# Patient Record
Sex: Female | Born: 1960 | Race: White | Hispanic: No | Smoking: Never smoker
Health system: Southern US, Community
[De-identification: ages and names within clinical notes are randomized; demographics above are authoritative.]

## PROBLEM LIST (undated history)

## (undated) DIAGNOSIS — E079 Disorder of thyroid, unspecified: Secondary | ICD-10-CM

## (undated) DIAGNOSIS — I10 Essential (primary) hypertension: Secondary | ICD-10-CM

## (undated) DIAGNOSIS — E119 Type 2 diabetes mellitus without complications: Secondary | ICD-10-CM

---

## 2006-09-19 ENCOUNTER — Ambulatory Visit (HOSPITAL_COMMUNITY): Admission: RE | Admit: 2006-09-19 | Discharge: 2006-09-19 | Payer: Self-pay | Admitting: Family Medicine

## 2006-09-27 ENCOUNTER — Encounter: Admission: RE | Admit: 2006-09-27 | Discharge: 2006-09-27 | Payer: Self-pay | Admitting: Family Medicine

## 2008-02-29 ENCOUNTER — Ambulatory Visit (HOSPITAL_COMMUNITY): Admission: RE | Admit: 2008-02-29 | Discharge: 2008-02-29 | Payer: Self-pay | Admitting: Family Medicine

## 2008-09-20 ENCOUNTER — Encounter (INDEPENDENT_AMBULATORY_CARE_PROVIDER_SITE_OTHER): Payer: Self-pay | Admitting: Obstetrics & Gynecology

## 2008-09-20 ENCOUNTER — Ambulatory Visit (HOSPITAL_COMMUNITY): Admission: RE | Admit: 2008-09-20 | Discharge: 2008-09-20 | Payer: Self-pay | Admitting: Obstetrics & Gynecology

## 2009-03-22 ENCOUNTER — Emergency Department (HOSPITAL_COMMUNITY): Admission: EM | Admit: 2009-03-22 | Discharge: 2009-03-22 | Payer: Self-pay | Admitting: Emergency Medicine

## 2009-03-23 ENCOUNTER — Encounter (INDEPENDENT_AMBULATORY_CARE_PROVIDER_SITE_OTHER): Payer: Self-pay | Admitting: General Surgery

## 2009-03-23 ENCOUNTER — Inpatient Hospital Stay (HOSPITAL_COMMUNITY): Admission: EM | Admit: 2009-03-23 | Discharge: 2009-03-27 | Payer: Self-pay | Admitting: Emergency Medicine

## 2010-05-18 LAB — DIFFERENTIAL
Band Neutrophils: 33 % — ABNORMAL HIGH (ref 0–10)
Basophils Absolute: 0 10*3/uL (ref 0.0–0.1)
Basophils Absolute: 0 10*3/uL (ref 0.0–0.1)
Basophils Absolute: 0 10*3/uL (ref 0.0–0.1)
Basophils Absolute: 0.1 10*3/uL (ref 0.0–0.1)
Basophils Relative: 0 % (ref 0–1)
Basophils Relative: 0 % (ref 0–1)
Basophils Relative: 0 % (ref 0–1)
Basophils Relative: 0 % (ref 0–1)
Basophils Relative: 1 % (ref 0–1)
Eosinophils Absolute: 0 10*3/uL (ref 0.0–0.7)
Eosinophils Absolute: 0 10*3/uL (ref 0.0–0.7)
Eosinophils Absolute: 0 10*3/uL (ref 0.0–0.7)
Eosinophils Absolute: 0.1 10*3/uL (ref 0.0–0.7)
Eosinophils Relative: 0 % (ref 0–5)
Eosinophils Relative: 0 % (ref 0–5)
Lymphocytes Relative: 4 % — ABNORMAL LOW (ref 12–46)
Lymphocytes Relative: 9 % — ABNORMAL LOW (ref 12–46)
Lymphs Abs: 0.6 10*3/uL — ABNORMAL LOW (ref 0.7–4.0)
Lymphs Abs: 0.7 10*3/uL (ref 0.7–4.0)
Monocytes Absolute: 0.6 10*3/uL (ref 0.1–1.0)
Monocytes Absolute: 0.8 10*3/uL (ref 0.1–1.0)
Monocytes Relative: 5 % (ref 3–12)
Monocytes Relative: 5 % (ref 3–12)
Neutro Abs: 12.4 10*3/uL — ABNORMAL HIGH (ref 1.7–7.7)
Neutro Abs: 7.5 10*3/uL (ref 1.7–7.7)
Neutrophils Relative %: 82 % — ABNORMAL HIGH (ref 43–77)
Neutrophils Relative %: 88 % — ABNORMAL HIGH (ref 43–77)
Neutrophils Relative %: 90 % — ABNORMAL HIGH (ref 43–77)
Promyelocytes Absolute: 0 %
WBC Morphology: INCREASED

## 2010-05-18 LAB — COMPREHENSIVE METABOLIC PANEL
ALT: 26 U/L (ref 0–35)
ALT: 32 U/L (ref 0–35)
AST: 22 U/L (ref 0–37)
Albumin: 3.5 g/dL (ref 3.5–5.2)
Alkaline Phosphatase: 54 U/L (ref 39–117)
Alkaline Phosphatase: 65 U/L (ref 39–117)
CO2: 23 mEq/L (ref 19–32)
Calcium: 9 mg/dL (ref 8.4–10.5)
Chloride: 105 mEq/L (ref 96–112)
Creatinine, Ser: 0.82 mg/dL (ref 0.4–1.2)
GFR calc Af Amer: >60 mL/min (ref >60)
GFR calc non Af Amer: >60 mL/min (ref >60)
Potassium: 3.2 mEq/L — ABNORMAL LOW (ref 3.5–5.1)
Sodium: 133 mEq/L — ABNORMAL LOW (ref 135–145)
Sodium: 138 mEq/L (ref 135–145)
Total Bilirubin: 0.8 mg/dL (ref 0.3–1.2)
Total Protein: 6.7 g/dL (ref 6.0–8.3)

## 2010-05-18 LAB — CBC
HCT: 32.9 % — ABNORMAL LOW (ref 36.0–46.0)
Hemoglobin: 11.3 g/dL — ABNORMAL LOW (ref 12.0–15.0)
MCHC: 33.9 g/dL (ref 30.0–36.0)
MCHC: 33.9 g/dL (ref 30.0–36.0)
MCHC: 34.3 g/dL (ref 30.0–36.0)
MCV: 84.7 fL (ref 78.0–100.0)
MCV: 85.3 fL (ref 78.0–100.0)
MCV: 86.1 fL (ref 78.0–100.0)
Platelets: 164 10*3/uL (ref 150–400)
Platelets: 184 10*3/uL (ref 150–400)
RBC: 3.88 MIL/uL (ref 3.87–5.11)
RBC: 4.2 MIL/uL (ref 3.87–5.11)
RBC: 4.58 MIL/uL (ref 3.87–5.11)
RDW: 13.9 % (ref 11.5–15.5)
RDW: 14 % (ref 11.5–15.5)
RDW: 14.1 % (ref 11.5–15.5)
WBC: 11.9 10*3/uL — ABNORMAL HIGH (ref 4.0–10.5)
WBC: 13.2 10*3/uL — ABNORMAL HIGH (ref 4.0–10.5)

## 2010-05-18 LAB — BASIC METABOLIC PANEL
CO2: 24 mEq/L (ref 19–32)
CO2: 25 mEq/L (ref 19–32)
Calcium: 7.7 mg/dL — ABNORMAL LOW (ref 8.4–10.5)
Chloride: 102 mEq/L (ref 96–112)
Creatinine, Ser: 0.61 mg/dL (ref 0.4–1.2)
GFR calc Af Amer: >60 mL/min (ref >60)
GFR calc Af Amer: >60 mL/min (ref >60)
GFR calc non Af Amer: >60 mL/min (ref >60)
Glucose, Bld: 115 mg/dL — ABNORMAL HIGH (ref 70–99)
Potassium: 3.7 mEq/L (ref 3.5–5.1)
Sodium: 132 mEq/L — ABNORMAL LOW (ref 135–145)

## 2010-05-18 LAB — URINALYSIS, ROUTINE W REFLEX MICROSCOPIC
Glucose, UA: 100 mg/dL — AB
Hgb urine dipstick: NEGATIVE
Protein, ur: 100 mg/dL — AB
Specific Gravity, Urine: 1.01 (ref 1.005–1.030)
Urobilinogen, UA: 1 mg/dL (ref 0.0–1.0)
Urobilinogen, UA: >8 mg/dL — ABNORMAL HIGH (ref 0.0–1.0)

## 2010-05-18 LAB — MAGNESIUM: Magnesium: 2.1 mg/dL (ref 1.5–2.5)

## 2010-05-18 LAB — URINE MICROSCOPIC-ADD ON

## 2010-05-18 LAB — LIPASE, BLOOD: Lipase: 24 U/L (ref 11–59)

## 2010-05-18 LAB — PHOSPHORUS: Phosphorus: 2.1 mg/dL — ABNORMAL LOW (ref 2.3–4.6)

## 2010-06-07 LAB — CBC
HCT: 38.8 % (ref 36.0–46.0)
Hemoglobin: 13.4 g/dL (ref 12.0–15.0)
RBC: 4.82 MIL/uL (ref 3.87–5.11)
RDW: 14.6 % (ref 11.5–15.5)
WBC: 7.1 10*3/uL (ref 4.0–10.5)

## 2010-07-14 NOTE — Op Note (Signed)
[name], Judith Cain NO.:  [id number]   MEDICAL RECORD NO.:  [id number]          PATIENT TYPE:  AMB   LOCATION:  SDC                           FACILITY:  [facility]   PHYSICIAN:  Ilda Mori, M.D.   DATE OF BIRTH:  [birth year]   DATE OF PROCEDURE:  09/20/2008  DATE OF DISCHARGE:  09/20/2008                               OPERATIVE REPORT   PREOPERATIVE DIAGNOSIS:  Menorrhagia, possible endometrial polyp.   POSTOPERATIVE DIAGNOSIS:  Menorrhagia.   PROCEDURES:  Hysteroscopy, dilation and curettage, and NovaSure  endometrial ablation.   SURGEON:  Ilda Mori, MD   ANESTHESIA:  General.   ESTIMATED BLOOD LOSS:  Minimal.   FINDINGS:  Normal-appearing uterine cavity.  No endometrial polyps seen.   SPECIMENS:  Endometrial curettings.   COMPLICATIONS:  None.   INDICATIONS:  This is a 50 year old gravida 4, para 65 female, who  developed over the last 2-3 years heavy painful menstrual periods.  These periods were not controlled by hormones and decision made to  proceed with endometrial ablation.   PROCEDURE IN DETAILS:  The patient was taken to the operating room and  general anesthesia was induced.  She was placed in dorsal lithotomy  position and the perineum and vagina were prepped and draped in sterile  fashion.  The cervix was sounded 3.5 cm and the endometrium was sounded  to 8.5 cm.  The internal os was dilated with [place] dilators to 19-  [place] and a diagnostic hysteroscope was introduced and the uterus was  viewed and no lesions or abnormalities were seen.  The hysteroscope was  removed.  The internal os was dilated further to a 25-French.  The  NovaSure instrument was placed and seated in the endometrial cavity, so  that the final width was 4.8 and the length had been calculated earlier  at 5 cm.  The CO2 test was passed and ablation was carried out for  approximately 1 minute and 55 seconds.  At this point, the instrument  automatically shut off and  was removed from the endometrial cavity and  the procedure was terminated.  The patient tolerated the procedure well  and left the operating room in good condition.     Ilda Mori, M.D.  Electronically Signed    RK/MEDQ  D:  09/20/2008  T:  09/21/2008  Job:  [id number]

## 2010-08-25 ENCOUNTER — Other Ambulatory Visit: Payer: Self-pay | Admitting: Obstetrics & Gynecology

## 2011-01-04 IMAGING — CT CT ABD-PELV W/ CM
2 of 5 series · 16 of 46 positions shown, 18 images · IV contrast (agent unspecified)
Comparison: 03/22/2009

CLINICAL DATA: Abdominal pain for 1 week.

CT ABDOMEN AND PELVIS WITH CONTRAST
TECHNIQUE: Multidetector CT imaging of the abdomen and pelvis was
performed following the standard protocol during bolus
administration of intravenous contrast.
Contrast: 100 ml 0mnipaque-IUU

[Series 2: abd_pel_with 5.0 b40f · axial · 0.78mm/px · z∈[-523,-88]mm · 13 of 99 slices shown, 15 images]
[im 6/99  soft-tissue]
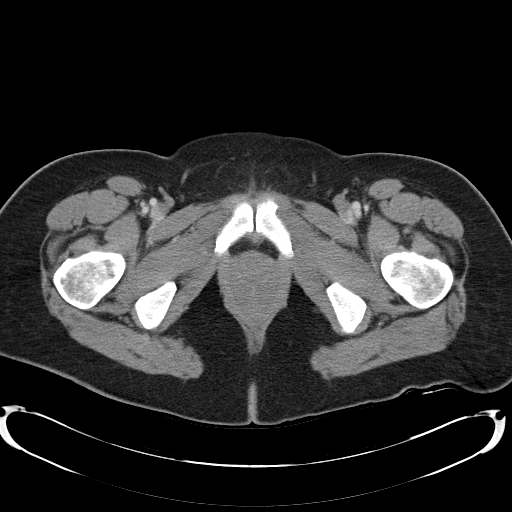
[im 6/99  bone]
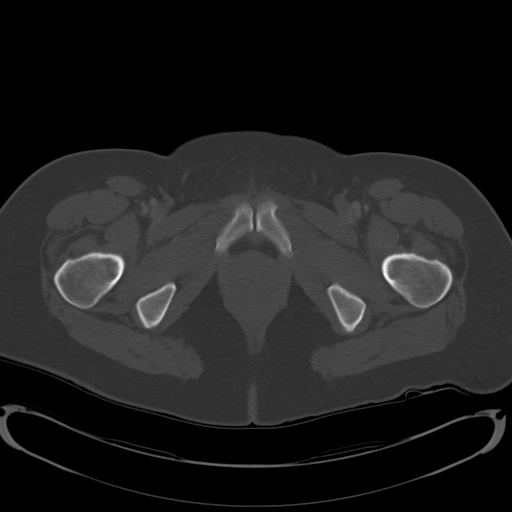
[im 11/99  soft-tissue]
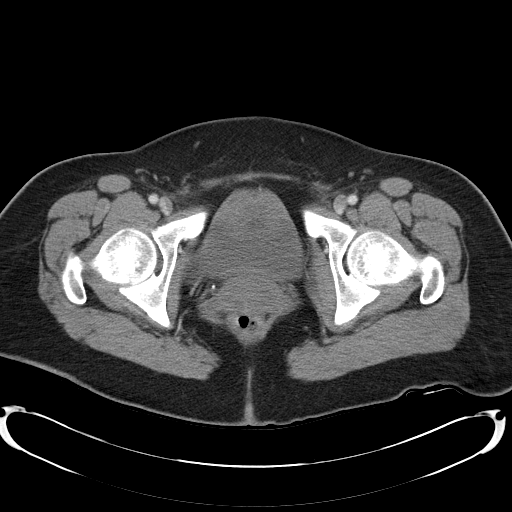
[im 22/99  soft-tissue]
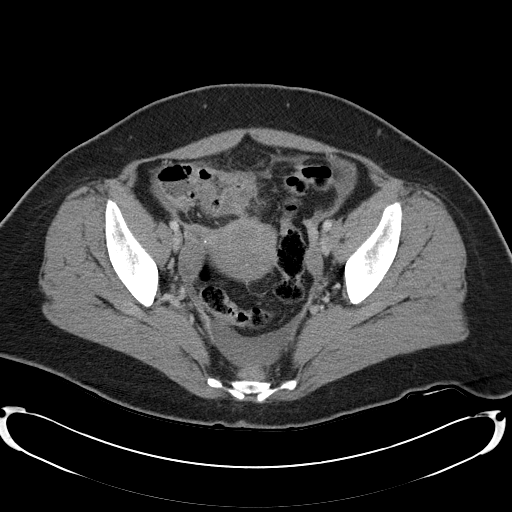
[im 28/99  soft-tissue]
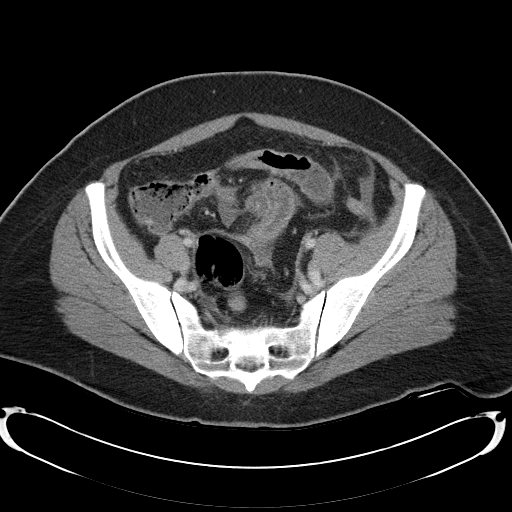
[im 33/99  soft-tissue]
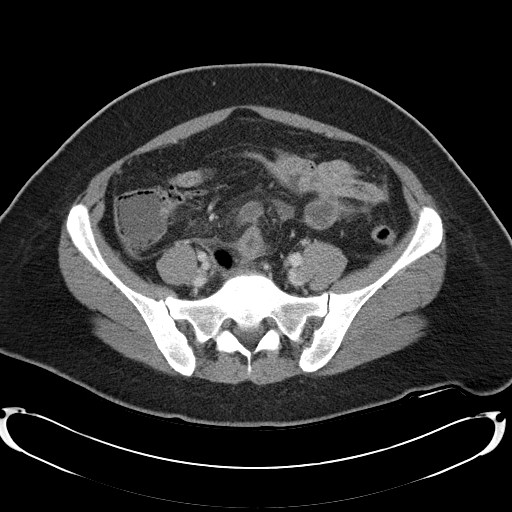
[im 44/99  soft-tissue]
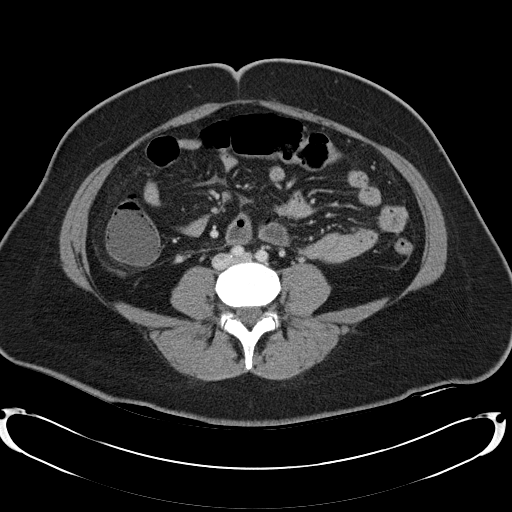
[im 50/99  soft-tissue]
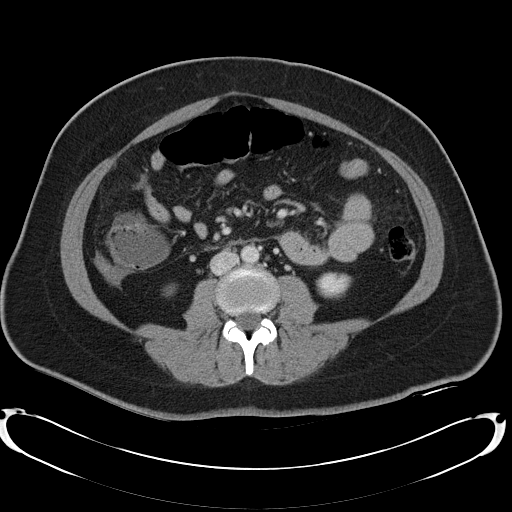
[im 55/99  soft-tissue]
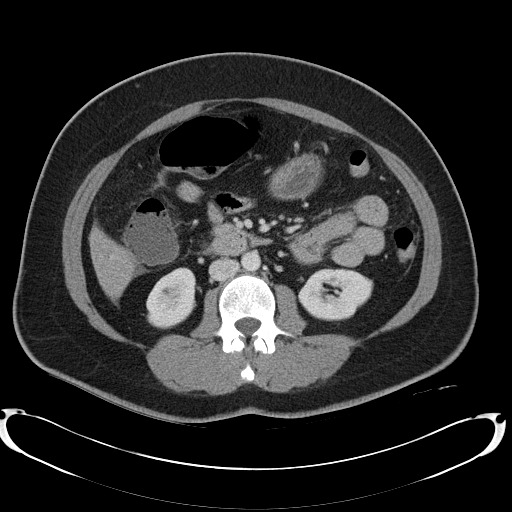
[im 66/99  soft-tissue]
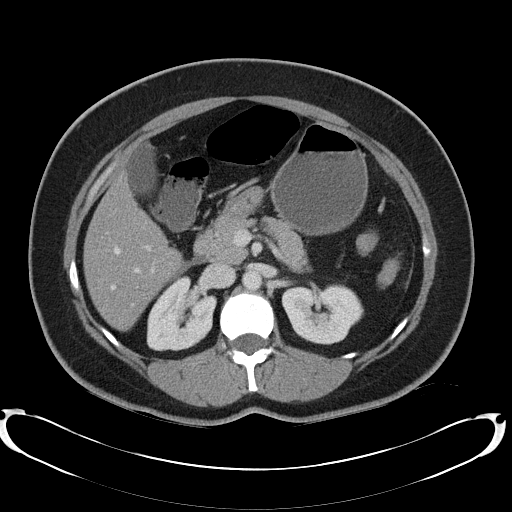
[im 66/99  bone]
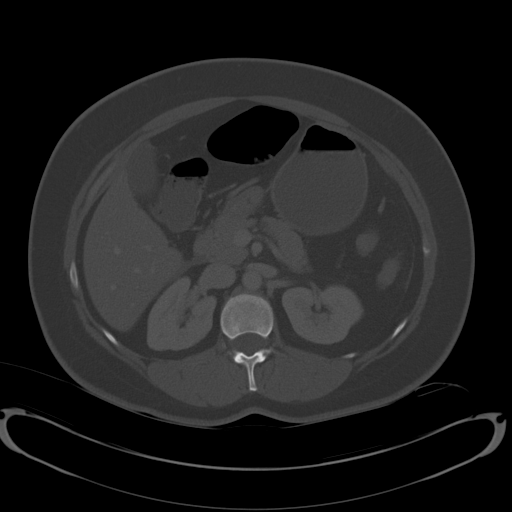
[im 71/99  soft-tissue]
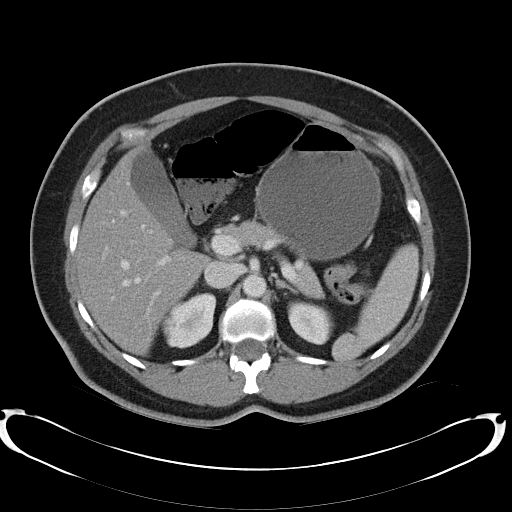
[im 77/99  soft-tissue]
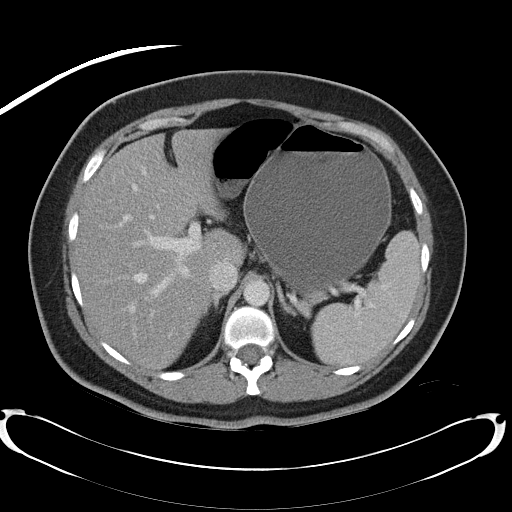
[im 88/99  soft-tissue]
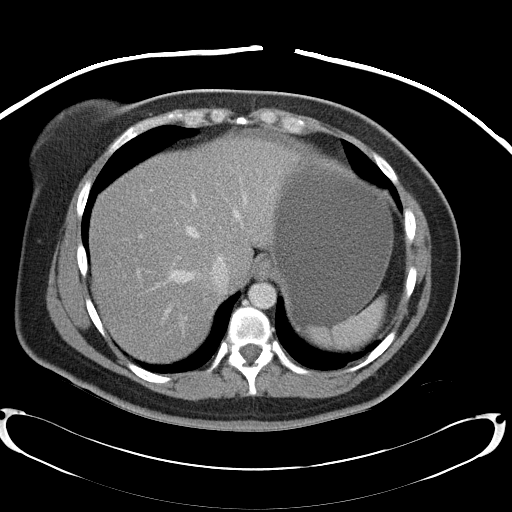
[im 93/99  soft-tissue]
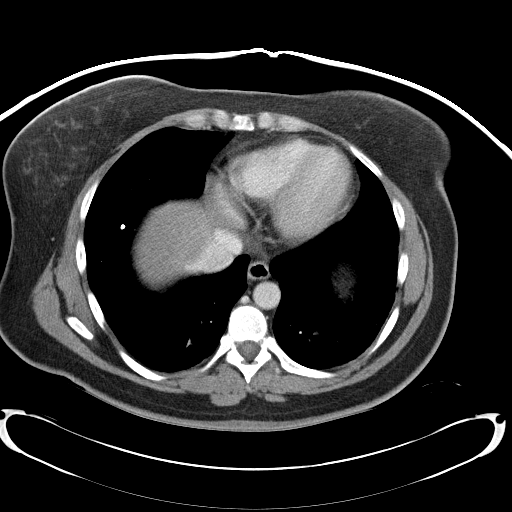

[Series 4: mpr cor post contrast (id) · coronal · 0.78mm/px · 3 of 95 slices shown]
[im 32/95  soft-tissue]
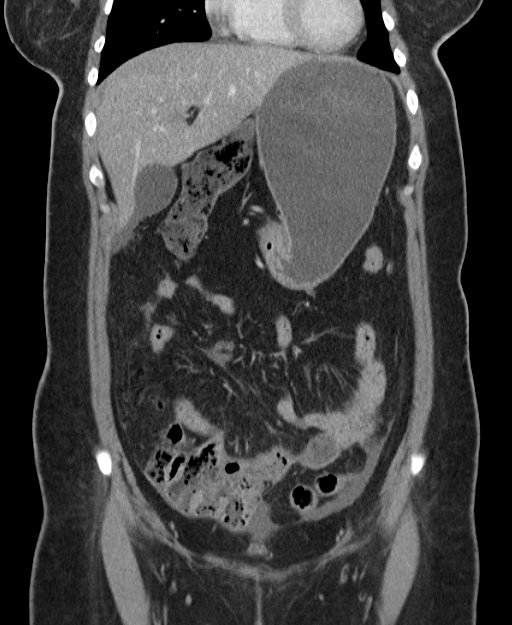
[im 42/95  soft-tissue]
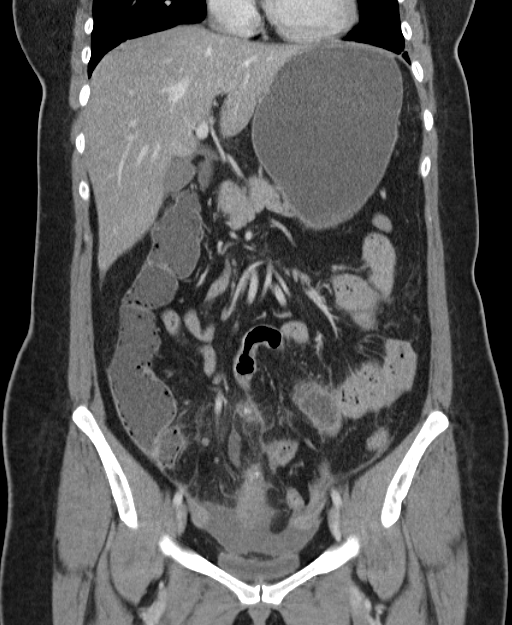
[im 53/95  soft-tissue]
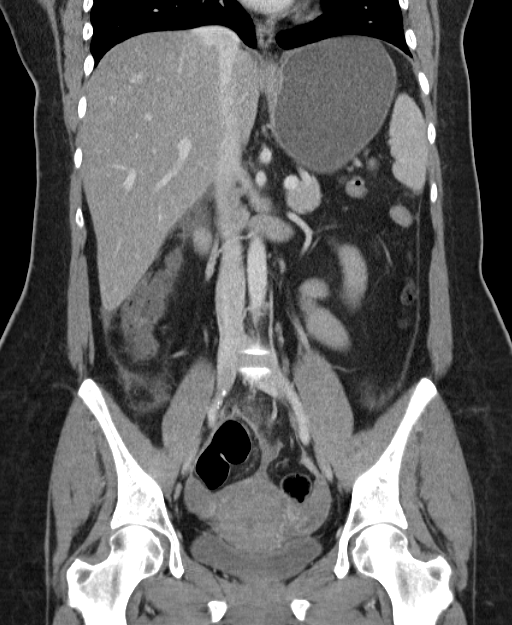

[16 of 46 positions shown; findings below may reference images not displayed]

FINDINGS: Calcified granulomas are present anteriorly in the right
lower lobe.  There is linear subsegmental atelectasis in the left
lower lobe.

Diffuse hepatic steatosis noted.  Acute appendicitis is present
with the appendix measuring up to 1.7 cm in diameter, with internal
appendicoliths.  There is stranding around the appendix as well as
fluid in the right pericolic gutter.  The appendix extends near the
midline, just above the uterine fundus.

The spleen appears unremarkable, as do the pancreas and adrenal
glands.  A hypodense 1 cm right renal hypodense lesion in the upper
pole likely represents a cyst.  There is a 2 mm nonobstructive
right kidney upper pole calculus, as well as some mild right renal
scarring.  The left kidney appears normal.

There is some secondary inflammation of the terminal ileum due to
the appendicitis.  Pelvic ascites noted, moderate in amount.  No
extraluminal gas is identified.  No discrete abscess noted.

Urinary bladder appears unremarkable.  The uterus and adnexa appear
normal.
IMPRESSION: 1.  Acute appendicitis with mild to moderate pelvic ascites.  No
discrete abscess or extraluminal gas.  The appendix is markedly
inflamed and contain several appendicoliths, and there is some
secondary inflammation of the adjacent terminal ileum.
2.  Diffuse hepatic steatosis.
3.  Old granulomatous disease.
4.  Suspected 2 mm right kidney upper pole nonobstructive calculus.

## 2012-03-01 HISTORY — PX: APPENDECTOMY: SHX54

## 2012-09-25 ENCOUNTER — Telehealth: Payer: Self-pay | Admitting: Gastroenterology

## 2012-09-25 NOTE — Telephone Encounter (Signed)
rec'd records from [facility] 4 pages to Dr.Kaplan

## 2014-09-19 ENCOUNTER — Other Ambulatory Visit: Payer: Self-pay | Admitting: Family Medicine

## 2014-09-19 DIAGNOSIS — Z1231 Encounter for screening mammogram for malignant neoplasm of breast: Secondary | ICD-10-CM

## 2014-09-26 ENCOUNTER — Ambulatory Visit: Payer: Self-pay

## 2014-10-17 ENCOUNTER — Ambulatory Visit: Payer: Self-pay

## 2015-01-29 DIAGNOSIS — G5602 Carpal tunnel syndrome, left upper limb: Secondary | ICD-10-CM | POA: Insufficient documentation

## 2015-01-29 DIAGNOSIS — I152 Hypertension secondary to endocrine disorders: Secondary | ICD-10-CM | POA: Insufficient documentation

## 2015-01-29 DIAGNOSIS — E1169 Type 2 diabetes mellitus with other specified complication: Secondary | ICD-10-CM | POA: Insufficient documentation

## 2015-01-29 DIAGNOSIS — K219 Gastro-esophageal reflux disease without esophagitis: Secondary | ICD-10-CM | POA: Insufficient documentation

## 2015-01-29 DIAGNOSIS — E039 Hypothyroidism, unspecified: Secondary | ICD-10-CM | POA: Insufficient documentation

## 2015-01-29 DIAGNOSIS — N393 Stress incontinence (female) (male): Secondary | ICD-10-CM | POA: Insufficient documentation

## 2017-06-19 ENCOUNTER — Emergency Department (HOSPITAL_COMMUNITY): Payer: BLUE CROSS/BLUE SHIELD

## 2017-06-19 ENCOUNTER — Encounter (HOSPITAL_COMMUNITY): Payer: Self-pay | Admitting: Emergency Medicine

## 2017-06-19 ENCOUNTER — Emergency Department (HOSPITAL_COMMUNITY)
Admission: EM | Admit: 2017-06-19 | Discharge: 2017-06-20 | Disposition: A | Discharge status: Home / Self Care | Payer: BLUE CROSS/BLUE SHIELD | Attending: Emergency Medicine | Admitting: Emergency Medicine

## 2017-06-19 ENCOUNTER — Other Ambulatory Visit: Payer: Self-pay

## 2017-06-19 DIAGNOSIS — I10 Essential (primary) hypertension: Secondary | ICD-10-CM | POA: Diagnosis not present

## 2017-06-19 DIAGNOSIS — E119 Type 2 diabetes mellitus without complications: Secondary | ICD-10-CM | POA: Insufficient documentation

## 2017-06-19 DIAGNOSIS — M5136 Other intervertebral disc degeneration, lumbar region: Secondary | ICD-10-CM | POA: Diagnosis not present

## 2017-06-19 DIAGNOSIS — J111 Influenza due to unidentified influenza virus with other respiratory manifestations: Secondary | ICD-10-CM | POA: Insufficient documentation

## 2017-06-19 DIAGNOSIS — R509 Fever, unspecified: Secondary | ICD-10-CM | POA: Diagnosis present

## 2017-06-19 HISTORY — DX: Type 2 diabetes mellitus without complications: E11.9

## 2017-06-19 HISTORY — DX: Disorder of thyroid, unspecified: E07.9

## 2017-06-19 HISTORY — DX: Essential (primary) hypertension: I10

## 2017-06-19 LAB — COMPREHENSIVE METABOLIC PANEL
ALBUMIN: 3.7 g/dL (ref 3.5–5.0)
ALK PHOS: 101 U/L (ref 38–126)
ALT: 22 U/L (ref 14–54)
AST: 18 U/L (ref 15–41)
Anion gap: 11 (ref 5–15)
BUN: 13 mg/dL (ref 6–20)
CALCIUM: 9.5 mg/dL (ref 8.9–10.3)
CO2: 23 mmol/L (ref 22–32)
CREATININE: 0.72 mg/dL (ref 0.44–1.00)
Chloride: 107 mmol/L (ref 101–111)
GFR calc Af Amer: >60 mL/min (ref >60)
GFR calc non Af Amer: >60 mL/min (ref >60)
GLUCOSE: 156 mg/dL — AB (ref 65–99)
Potassium: 4 mmol/L (ref 3.5–5.1)
Sodium: 141 mmol/L (ref 135–145)
TOTAL PROTEIN: 7.1 g/dL (ref 6.5–8.1)
Total Bilirubin: 0.7 mg/dL (ref 0.3–1.2)

## 2017-06-19 LAB — CBC WITH DIFFERENTIAL/PLATELET
BASOS ABS: 0 10*3/uL (ref 0.0–0.1)
BASOS PCT: 0 %
EOS PCT: 2 %
Eosinophils Absolute: 0.1 10*3/uL (ref 0.0–0.7)
HCT: 34.2 % — ABNORMAL LOW (ref 36.0–46.0)
Hemoglobin: 11.1 g/dL — ABNORMAL LOW (ref 12.0–15.0)
Lymphocytes Relative: 23 %
Lymphs Abs: 2 10*3/uL (ref 0.7–4.0)
MCH: 27.8 pg (ref 26.0–34.0)
MCHC: 32.5 g/dL (ref 30.0–36.0)
MCV: 85.5 fL (ref 78.0–100.0)
Monocytes Absolute: 0.6 10*3/uL (ref 0.1–1.0)
Monocytes Relative: 7 %
Neutro Abs: 6.1 10*3/uL (ref 1.7–7.7)
Neutrophils Relative %: 68 %
PLATELETS: 272 10*3/uL (ref 150–400)
RBC: 4 MIL/uL (ref 3.87–5.11)
RDW: 13.2 % (ref 11.5–15.5)
WBC: 8.8 10*3/uL (ref 4.0–10.5)

## 2017-06-19 LAB — URINALYSIS, ROUTINE W REFLEX MICROSCOPIC
Bilirubin Urine: NEGATIVE
GLUCOSE, UA: NEGATIVE mg/dL
Hgb urine dipstick: NEGATIVE
KETONES UR: NEGATIVE mg/dL
LEUKOCYTES UA: NEGATIVE
NITRITE: NEGATIVE
PROTEIN: NEGATIVE mg/dL
Specific Gravity, Urine: 1.012 (ref 1.005–1.030)
pH: 7 (ref 5.0–8.0)

## 2017-06-19 LAB — GROUP A STREP BY PCR: Group A Strep by PCR: NOT DETECTED

## 2017-06-19 MED ORDER — ONDANSETRON HCL 4 MG/2ML IJ SOLN
4.0000 mg | Freq: Once | INTRAMUSCULAR | Status: AC
  Administered 2017-06-19: 4 mg via INTRAVENOUS
  Filled 2017-06-19: qty 2

## 2017-06-19 MED ORDER — IBUPROFEN 400 MG PO TABS
400.0000 mg | ORAL_TABLET | Freq: Once | ORAL | Status: AC
  Administered 2017-06-19: 400 mg via ORAL
  Filled 2017-06-19: qty 1

## 2017-06-19 MED ORDER — SODIUM CHLORIDE 0.9 % IV SOLN: 1000.0000 mL | INTRAVENOUS | Status: DC

## 2017-06-19 MED ORDER — SODIUM CHLORIDE 0.9 % IV BOLUS (SEPSIS)
1000.0000 mL | Freq: Once | INTRAVENOUS | Status: AC
  Administered 2017-06-19: 1000 mL via INTRAVENOUS

## 2017-06-19 MED ORDER — HYDROCODONE-ACETAMINOPHEN 5-325 MG PO TABS
2.0000 | ORAL_TABLET | Freq: Once | ORAL | Status: AC
  Administered 2017-06-19: 2 via ORAL
  Filled 2017-06-19: qty 2

## 2017-06-19 NOTE — ED Notes (Signed)
Pt denies cough, fever started last week, dx with flu and has finished tamiflu.  Pt with continued with bilat. LE pain. Pt with nausea but denies emesis.  Motrin last at 1330 today.

## 2017-06-19 NOTE — ED Provider Notes (Signed)
[facility] EMERGENCY DEPARTMENT Provider Note   CSN: [id number] Arrival date & time: 06/19/17  2142     History   Chief Complaint Chief Complaint  Patient presents with  . Fever    HPI Judith Cain is a 57 y.o. female.  The patient is a 57 year old female who presents to the emergency department with a complaint of fever and body aches.  Patient states this is been going on for approximately a week.  About for 5 days ago the patient says she was diagnosed with influenza A.  She was placed on Tamiflu.  She says she has finished the Tamiflu flu, and continues to have headaches, neck soreness.  She had a temperature a few days ago with a maximum of 101.  She says this has been improving over the last 48-72 hours.  She has had problems with nausea.  There is been no recent rash reported.  No changes in her stool, no dysuria, no hematuria.  It is of note that she has back pain.  She had back pain before for the issue was noted with the flu.  The patient states that she has diabetes, and she is concerned that she is still having symptoms after the diagnosis of the flu and being treated with Tamiflu.  The patient states that she has pain and weakness from the knees down to her feet, and she is also concerned that something with her back may be getting worse causing this problem.  No recent falls.     Past Medical History:  Diagnosis Date  . Diabetes mellitus without complication (HCC)   . Hypertension   . Thyroid disease     There are no active problems to display for this patient.   Past Surgical History:  Procedure Laterality Date  . APPENDECTOMY  2014     OB History   None      Home Medications    Prior to Admission medications   Not on File    Family History History reviewed. No pertinent family history.  Social History Social History   Tobacco Use  . Smoking status: Never Smoker  . Smokeless tobacco: Never Used  Substance Use Topics  . Alcohol use:  Never    Frequency: Never  . Drug use: Never     Allergies   Patient has no allergy information on record.   Review of Systems Review of Systems  Constitutional: Positive for activity change, appetite change, chills and fever.       All ROS Neg except as noted in HPI  HENT: Positive for congestion. Negative for nosebleeds.   Eyes: Negative for photophobia and discharge.  Respiratory: Negative for cough, shortness of breath and wheezing.   Cardiovascular: Negative for chest pain, palpitations and leg swelling.  Gastrointestinal: Positive for nausea. Negative for abdominal pain and blood in stool.  Genitourinary: Negative for dysuria, flank pain, frequency and hematuria.  Musculoskeletal: Positive for back pain, myalgias and neck pain. Negative for arthralgias.  Skin: Negative.   Neurological: Positive for headaches. Negative for dizziness, seizures and speech difficulty.  Psychiatric/Behavioral: Negative for confusion and hallucinations.     Physical Exam Updated Vital Signs BP 136/69   Pulse 87   Temp 99.7 F (37.6 C) (Oral)   Resp 20   Wt 86.2 kg (190 lb)   SpO2 99%   Physical Exam  Constitutional: She is oriented to person, place, and time. She appears well-developed and well-nourished.  Non-toxic appearance.  HENT:  Head:  Normocephalic.  Right Ear: Tympanic membrane and external ear normal.  Left Ear: Tympanic membrane and external ear normal.  Eyes: Pupils are equal, round, and reactive to light. EOM and lids are normal.  Neck: Normal range of motion. Neck supple. Carotid bruit is not present.  Cardiovascular: Normal rate, regular rhythm, normal heart sounds, intact distal pulses and normal pulses.  Pulmonary/Chest: Breath sounds normal. No respiratory distress.  Abdominal: Soft. Bowel sounds are normal. There is no tenderness. There is no guarding.  No abdominal mass or pulsating mass appreciated.  Musculoskeletal: Normal range of motion.  There is soreness of  the cervical spine with range of motion.  There is no palpable step-off.  There are no hot areas appreciated.  There is no palpable step-off of the thoracic spine.  There is no palpable step-off of the lumbar spine.  There is pain to palpation of the lower lumbar spine.  There is pain with change of position in the lumbar area and on the left paraspinal area.  There is good range of motion of the upper and lower extremities.  The patient is slower to move the lower extremities.  Radial pulses are 2+.  Dorsalis pedis pulses are 2+.  No significant swelling of the lower extremities.  Negative Homans sign.  No temperature changes noted of the lower extremities.  Lymphadenopathy:       Head (right side): No submandibular adenopathy present.       Head (left side): No submandibular adenopathy present.    She has no cervical adenopathy.  Neurological: She is alert and oriented to person, place, and time. She has normal strength. No cranial nerve deficit or sensory deficit.  No sensory deficit noted of the lower extremities.  Patient states she has pain with attempting to comply with my examination of the motor aspect of the lower extremities.  Skin: Skin is warm and dry.  Psychiatric: She has a normal mood and affect. Her speech is normal.  Nursing note and vitals reviewed.    ED Treatments / Results  Labs (all labs ordered are listed, but only abnormal results are displayed) Labs Reviewed - No data to display  EKG None  Radiology No results found.  Procedures Procedures (including critical care time)  Medications Ordered in ED Medications - No data to display   Initial Impression / Assessment and Plan / ED Course  I have reviewed the triage vital signs and the nursing notes.  Pertinent labs & imaging results that were available during my care of the patient were reviewed by me and considered in my medical decision making (see chart for details).       Final Clinical  Impressions(s) / ED Diagnoses  Vital signs within normal limits with the exception of the temperature being 99.7.  Pulse oximetry is 99% on room air.  Will check for strep infection, pneumonia, spinal lesion causing pain and or weakness of the lower extremity, electrolyte imbalances.  No evidence on examination for meningitis.  No gross neurologic deficits appreciated on examination.  Glucose is slightly elevated on the comprehensive metabolic panel at 156, otherwise the metabolic panel is essentially within normal limits.  The anion gap is 11. The complete blood count shows the white blood cells to be normal at 8800, hemoglobin and hematocrit were slightly low at 11.1, and 34.2.  Urine analysis is negative for acute urinary infection or kidney stone.  Strep test is negative.  Chest x-ray shows no acute cardiopulmonary issues.  In particular there  is no evidence of pneumonia.  CT scan of the lumbar spine reveals several areas of bulging of the lumbar disc, but no significant canal stenosis or foraminal narrowing.  There is noted some aortic atherosclerosis.  I discussed these findings with the patient in terms of which she understands.  The patient states she feels some better after ibuprofen and pain medication and IV fluids.  The nausea has improved after IV Zofran.  The patient will be treated with prescription of Zofran, ibuprofen, and Ultram.  I have asked the patient to increase fluids.  And to follow-up with her primary physician concerning these flulike symptoms, as well as the lower extremity pain.  I have asked her to return to the emergency department immediately if any changes in her condition, problems, or concerns.   Final diagnoses:  Influenza  DDD (degenerative disc disease), lumbar    ED Discharge Orders        Ordered    ondansetron (ZOFRAN) 4 MG tablet  3 times daily after meals & bedtime     06/20/17 0117    ibuprofen (ADVIL,MOTRIN) 400 MG tablet  Every 6 hours PRN      06/20/17 0117    traMADol (ULTRAM) 50 MG tablet     06/20/17 0117       Ivery QualeBryant, Malayjah Otoole, PA-C 06/20/17 0117    Samuel JesterMcManus, Kathleen, DO 06/20/17 2350

## 2017-06-19 NOTE — ED Triage Notes (Signed)
Pt was at PCP Monday and told she has flu, given Tamiflu, pt c/o fever, body aches & flu like symptoms

## 2017-06-20 ENCOUNTER — Emergency Department (HOSPITAL_COMMUNITY): Payer: BLUE CROSS/BLUE SHIELD

## 2017-06-20 MED ORDER — ONDANSETRON HCL 4 MG PO TABS: 4.0000 mg | ORAL_TABLET | Freq: Three times a day (TID) | ORAL | 0 refills | Status: DC

## 2017-06-20 MED ORDER — TRAMADOL HCL 50 MG PO TABS: ORAL_TABLET | ORAL | 0 refills | Status: DC

## 2017-06-20 MED ORDER — IBUPROFEN 400 MG PO TABS: 400.0000 mg | ORAL_TABLET | Freq: Four times a day (QID) | ORAL | 0 refills | Status: DC | PRN

## 2017-12-23 ENCOUNTER — Other Ambulatory Visit: Payer: Self-pay

## 2017-12-29 ENCOUNTER — Ambulatory Visit (INDEPENDENT_AMBULATORY_CARE_PROVIDER_SITE_OTHER): Payer: BLUE CROSS/BLUE SHIELD | Admitting: Cardiology

## 2017-12-29 ENCOUNTER — Encounter: Payer: Self-pay | Admitting: Cardiology

## 2017-12-29 VITALS — BP 119/81 | HR 80 | Ht 67.0 in | Wt 203.0 lb

## 2017-12-29 DIAGNOSIS — Z7189 Other specified counseling: Secondary | ICD-10-CM | POA: Diagnosis not present

## 2017-12-29 DIAGNOSIS — I700 Atherosclerosis of aorta: Secondary | ICD-10-CM

## 2017-12-29 DIAGNOSIS — Z8249 Family history of ischemic heart disease and other diseases of the circulatory system: Secondary | ICD-10-CM

## 2017-12-29 DIAGNOSIS — E6609 Other obesity due to excess calories: Secondary | ICD-10-CM | POA: Diagnosis not present

## 2017-12-29 DIAGNOSIS — E782 Mixed hyperlipidemia: Secondary | ICD-10-CM

## 2017-12-29 DIAGNOSIS — Z6831 Body mass index (BMI) 31.0-31.9, adult: Secondary | ICD-10-CM

## 2017-12-29 NOTE — Progress Notes (Signed)
Cardiology Office Note:    Date:  12/29/2017   ID:  Judith Cain, DOB 01/29/1961, MRN [id number]  PCP:  Richmond Campbell., PA-C  Cardiologist:  Jodelle Red, MD PhD  Referring MD: Richmond Campbell., PA-C   CC: establish care, discuss imaging/risk factors  History of Present Illness:    Judith Cain is a 57 y.o. female with a hx of type II diabetes, hypertension, hyperlipidemia who is seen as a new consult at the request of Arlyce Dice, Kristen W., PA-C for the evaluation and management of incidental aortic atherosclerosis, hypertension, and hyperlipidemia.   There is a note in her PCP visit from 12/01/17 noting aortic atherosclerosis, and per the patient this was based on the CT lumbar she had in April noting aortic atherosclerosis. Her last lipids in Care Everywhere were notable for LDL 127, Tchol 184, TG 194, HDL 39. Her last blood pressure reading was 100/72. Her medication list notes losartan, HCTZ, atorvastatin, metformin, and levothyroxine.   Just had her atorvastatin increased to 40 mg a few weeks ago. Tolerating well.   No chest pain. 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 No syncope, no palpitations. No PND or orthopnea. Reports that she snores, wakes up tired. Occasional LE edema when she has been on her feet all day.  FH: father had mitral valve prolapse, son does as well. Father had a pacemaker, prior MI, no bypass surgery. Passed at age 66 after pneumonia and norovirus. Mother is a diabetic but no heart issues. Maternal gpa had MI, maternal gma had stroke.   SH: never smoker. No alcohol. Active at job but no other intentional activity. Diet: avoids sweets, likes potatoes.   Past Medical History:  Diagnosis Date  . Diabetes mellitus without complication (HCC)   . Hypertension   . Thyroid disease     Past Surgical History:  Procedure Laterality Date  . APPENDECTOMY  2014    Current Medications: Current Outpatient  Medications on File Prior to Visit  Medication Sig  . aspirin 81 MG chewable tablet Chew 81 mg by mouth daily.  Marland Kitchen atorvastatin (LIPITOR) 40 MG tablet Take 40 mg by mouth daily.   . hydrochlorothiazide (HYDRODIURIL) 25 MG tablet Take 25 mg by mouth daily.  . IRON PO Take by mouth.  . levothyroxine (SYNTHROID, LEVOTHROID) 50 MCG tablet Take 50 mcg by mouth daily.  Marland Kitchen losartan (COZAAR) 25 MG tablet Take 25 mg by mouth daily.  . metFORMIN (GLUCOPHAGE) 500 MG tablet Take 500 mg by mouth 2 (two) times daily.  Marland Kitchen ibuprofen (ADVIL,MOTRIN) 400 MG tablet Take 1 tablet (400 mg total) by mouth every 6 (six) hours as needed. (Patient not taking: Reported on 12/29/2017)  . ondansetron (ZOFRAN) 4 MG tablet Take 1 tablet (4 mg total) by mouth 4 (four) times daily - after meals and at bedtime. (Patient not taking: Reported on 12/29/2017)  . traMADol (ULTRAM) 50 MG tablet 1 or 2 po q6h prn pain (Patient not taking: Reported on 12/29/2017)   No current facility-administered medications on file prior to visit.      Allergies:   Patient has no allergy information on record.   Social History   Socioeconomic History  . Marital status: Married    Spouse name: Not on file  . Number of children: Not on file  . Years of education: Not on file  . Highest education level: Not on file  Occupational History  . Not on file  Social Needs  .  Financial resource strain: Not on file  . Food insecurity:    Worry: Not on file    Inability: Not on file  . Transportation needs:    Medical: Not on file    Non-medical: Not on file  Tobacco Use  . Smoking status: Never Smoker  . Smokeless tobacco: Never Used  Substance and Sexual Activity  . Alcohol use: Never    Frequency: Never  . Drug use: Never  . Sexual activity: Never  Lifestyle  . Physical activity:    Days per week: Not on file    Minutes per session: Not on file  . Stress: Not on file  Relationships  . Social connections:    Talks on phone: Not on file     Gets together: Not on file    Attends religious service: Not on file    Active member of club or organization: Not on file    Attends meetings of clubs or organizations: Not on file    Relationship status: Not on file  Other Topics Concern  . Not on file  Social History Narrative  . Not on file     Family History:  father had mitral valve prolapse, son does as well. Father had a pacemaker, prior MI, no bypass surgery. Passed at age 70 after pneumonia and norovirus. Mother is a diabetic but no heart issues. Maternal gpa had MI, maternal gma had stroke.   ROS:   Please see the history of present illness.  Additional pertinent ROS:  Constitutional: Negative for chills, fever, night sweats, unintentional weight loss  HENT: Negative for ear pain and hearing loss.   Eyes: Negative for loss of vision and eye pain.  Respiratory: Negative for cough, sputum, shortness of breath, wheezing.   Cardiovascular: Negative for chest pain, palpitations , PND, orthopnea, lower extremity edema and claudication.  Gastrointestinal: Negative for abdominal pain, melena, and hematochezia.  Genitourinary: Negative for dysuria and hematuria.  Musculoskeletal: Negative for falls and myalgias.  Skin: Negative for itching and rash.  Neurological: Negative for focal weakness, focal sensory changes and loss of consciousness.  Endo/Heme/Allergies: Does not bruise/bleed easily.    EKGs/Labs/Other Studies Reviewed:    The following studies were reviewed today: Note from [facility] in [place]   EKG:  EKG is ordered today.  The ekg ordered today demonstrates normal sinus rhythm  Recent Labs: 06/19/2017: ALT 22; BUN 13; Creatinine, Ser 0.72; Hemoglobin 11.1; Platelets 272; Potassium 4.0; Sodium 141  Recent Lipid Panel No results found for: CHOL, TRIG, HDL, CHOLHDL, VLDL, LDLCALC, LDLDIRECT  Physical Exam:    VS:  BP 119/81 (BP Location: Left Arm, Patient Position: Sitting, Cuff Size:  Normal)   Pulse 80   Ht 5\' 7"  (1.702 m)   Wt 203 lb (92.1 kg)   BMI 31.79 kg/m     Wt Readings from Last 3 Encounters:  12/29/17 203 lb (92.1 kg)  06/19/17 190 lb (86.2 kg)     GEN: Well nourished, well developed in no acute distress HEENT: Normal NECK: No JVD; No carotid bruits LYMPHATICS: No lymphadenopathy CARDIAC: regular rhythm, normal S1 and S2, no murmurs, rubs, gallops. Radial and DP pulses 2+ bilaterally. RESPIRATORY:  Clear to auscultation without rales, wheezing or rhonchi  ABDOMEN: Soft, non-tender, non-distended MUSCULOSKELETAL:  No edema; No deformity  SKIN: Warm and dry NEUROLOGIC:  Alert and oriented x 3 PSYCHIATRIC:  Normal affect   ASSESSMENT:    1. Aortic atherosclerosis (HCC)   2. Mixed hyperlipidemia  3. Class 1 obesity due to excess calories with serious comorbidity and body mass index (BMI) of 31.0 to 31.9 in adult   4. Counseling on health promotion and disease prevention   5. Family history of heart disease    PLAN:    Cardiovascular significant risk factors: aortic atherosclerosis, hyperlipidemia, obesity, family history -no active symptoms requiring urgent evaluation -spent >40 minutes discussing risk factors and counseling on current guidelines and recommendations  -recommend heart healthy/Mediterranean diet, with whole grains, fruits, vegetable, fish, lean meats, nuts, and olive oil. Limit salt. -recommend moderate walking, 3-5 times/week for 30-50 minutes each session. Aim for at least 150 minutes.week. Goal should be pace of 3 miles/hours, or walking 1.5 miles in 30 minutes -recommend avoidance of tobacco products. Avoid excess alcohol. -Additional risk factor control:  -Diabetes: A1c is not available to me, but reports good control on metformin. If additional agents needed, recommend SGLT2 inhibitor as first choice, then DPP4 given potential cardiovascular benefits.  -Lipids: no symptomatic ASCVD but incidental calcium. Goal LDL would be  <100 mg/dl with diabetes. She was just started on atorvastatin 40 mg. Pending recheck with her PCP  -Blood pressure control: goal <130/80, on ARB  -Weight: counseled on lifestyle management. BMI 31.8 -ASCVD risk score: The 10-year ASCVD risk score Denman George DC Montez Hageman., et al., 2013) is: 5.7%   Values used to calculate the score:     Age: 89 years     Sex: Female     Is Non-Hispanic African American: No     Diabetic: Yes     Tobacco smoker: No     Systolic Blood Pressure: 119 mmHg     Is BP treated: Yes     HDL Cholesterol: 43 MG/DL     Total Cholesterol: 204 MG/DL   Plan for follow up: 1 year for prevention  Medication Adjustments/Labs and Tests Ordered: Current medicines are reviewed at length with the patient today.  Concerns regarding medicines are outlined above.  Orders Placed This Encounter  Procedures  . EKG 12-Lead   No orders of the defined types were placed in this encounter.   Patient Instructions  Medication Instructions:  Your Physician recommend you continue on your current medication as directed.    If you need a refill on your cardiac medications before your next appointment, please call your pharmacy.   Lab work: None   Testing/Procedures: None  Follow-Up: At [facility], you and your health needs are our priority.  As part of our continuing mission to provide you with exceptional heart care, we have created designated Provider Care Teams.  These Care Teams include your primary Cardiologist (physician) and Advanced Practice Providers (APPs -  Physician Assistants and Nurse Practitioners) who all work together to provide you with the care you need, when you need it. You will need a follow up appointment in 1 years.  Please call our office 2 months in advance to schedule this appointment.  You may see Jodelle Red, MD or one of the following Advanced Practice Providers on your designated Care Team:   Theodore Demark, PA-C . Joni Reining, DNP,  ANP  Any Other Special Instructions Will Be Listed Below (If Applicable).       Signed, Jodelle Red, MD PhD 12/29/2017 9:27 AM    [facility]

## 2017-12-29 NOTE — Patient Instructions (Signed)

## 2018-12-27 ENCOUNTER — Telehealth: Payer: Self-pay | Admitting: *Deleted

## 2018-12-27 NOTE — Telephone Encounter (Signed)
A message was left, re: her follow up visit. 

## 2019-04-03 IMAGING — DX DG CHEST 2V
2 series · 2 of 2 positions shown · non-contrast
Comparison: None.

CLINICAL DATA: Fever

EXAM:
CHEST - 2 VIEW

[chest pa]
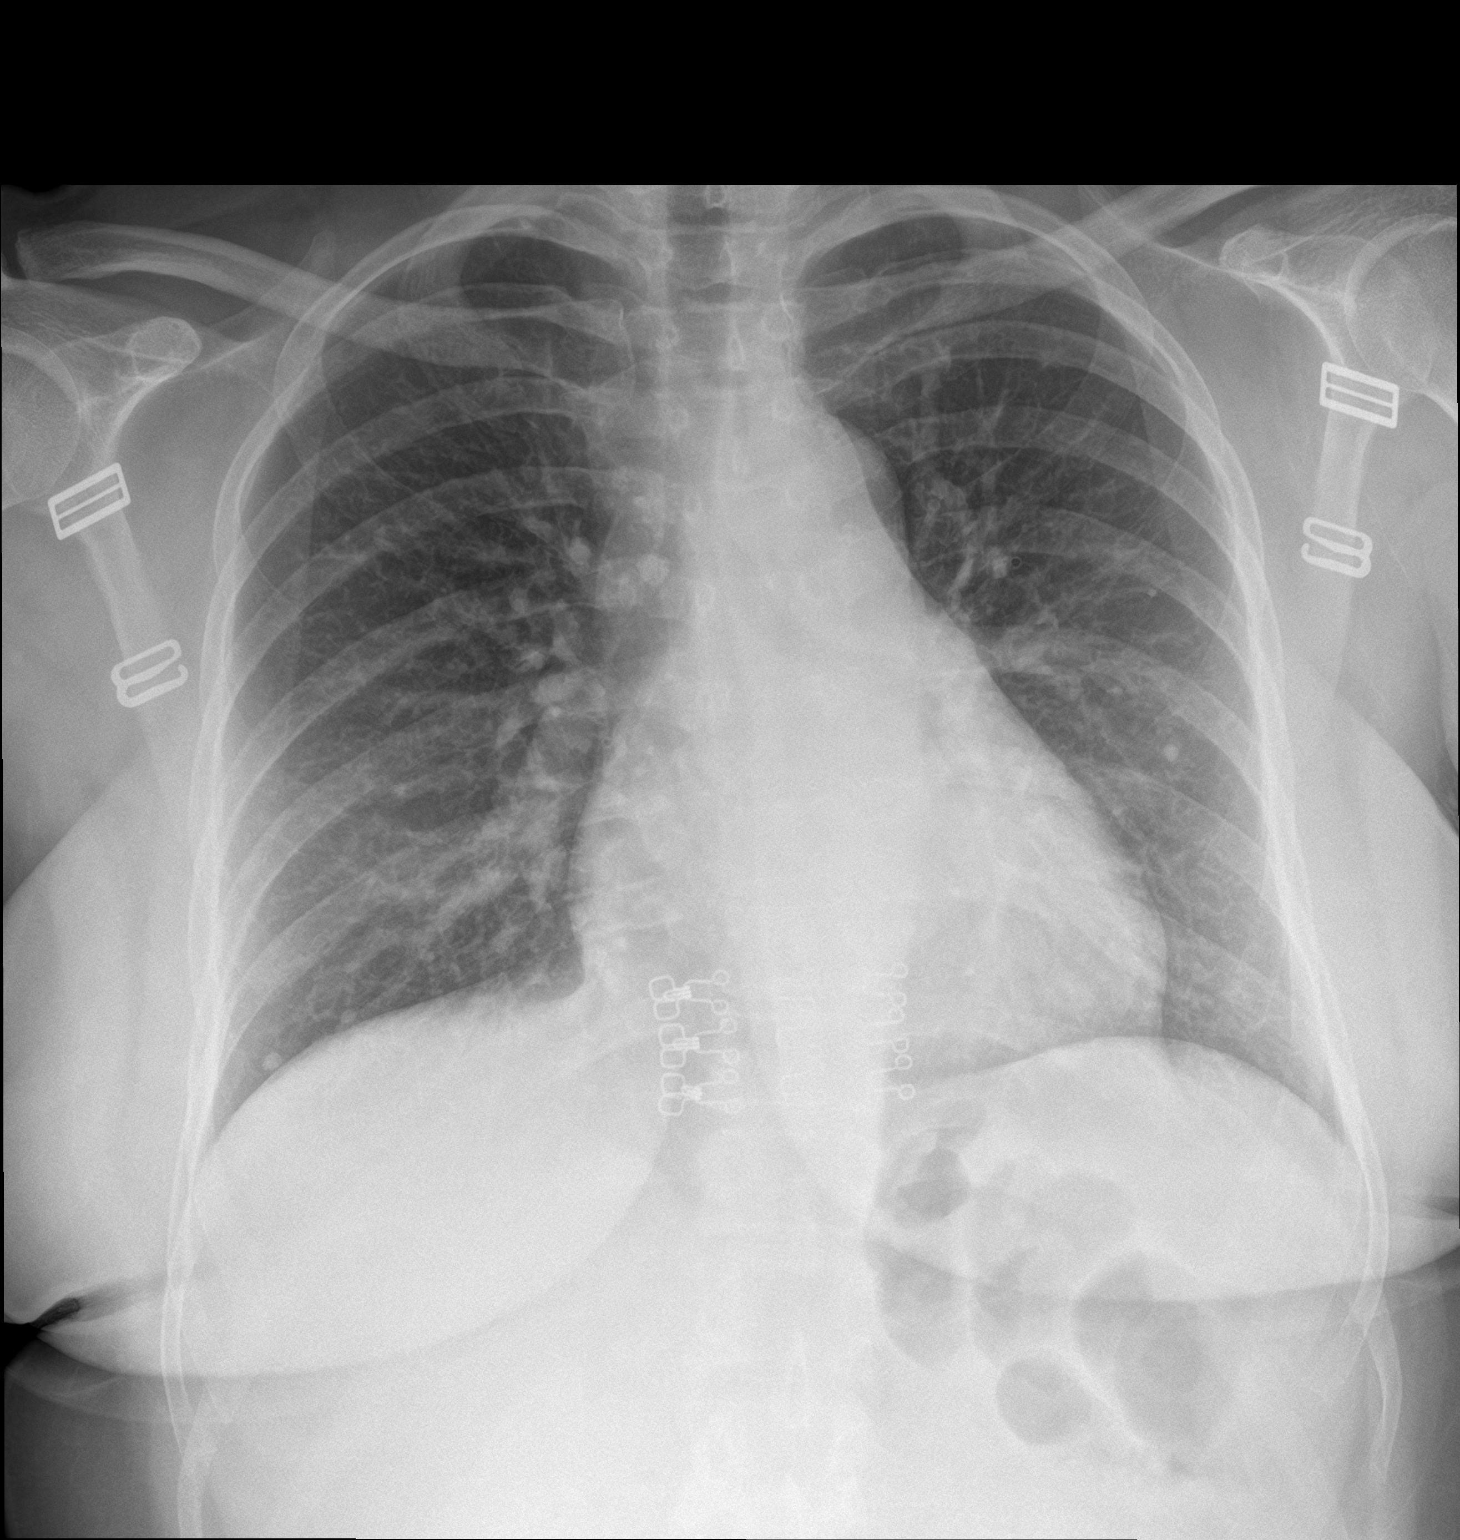

[chest lat]
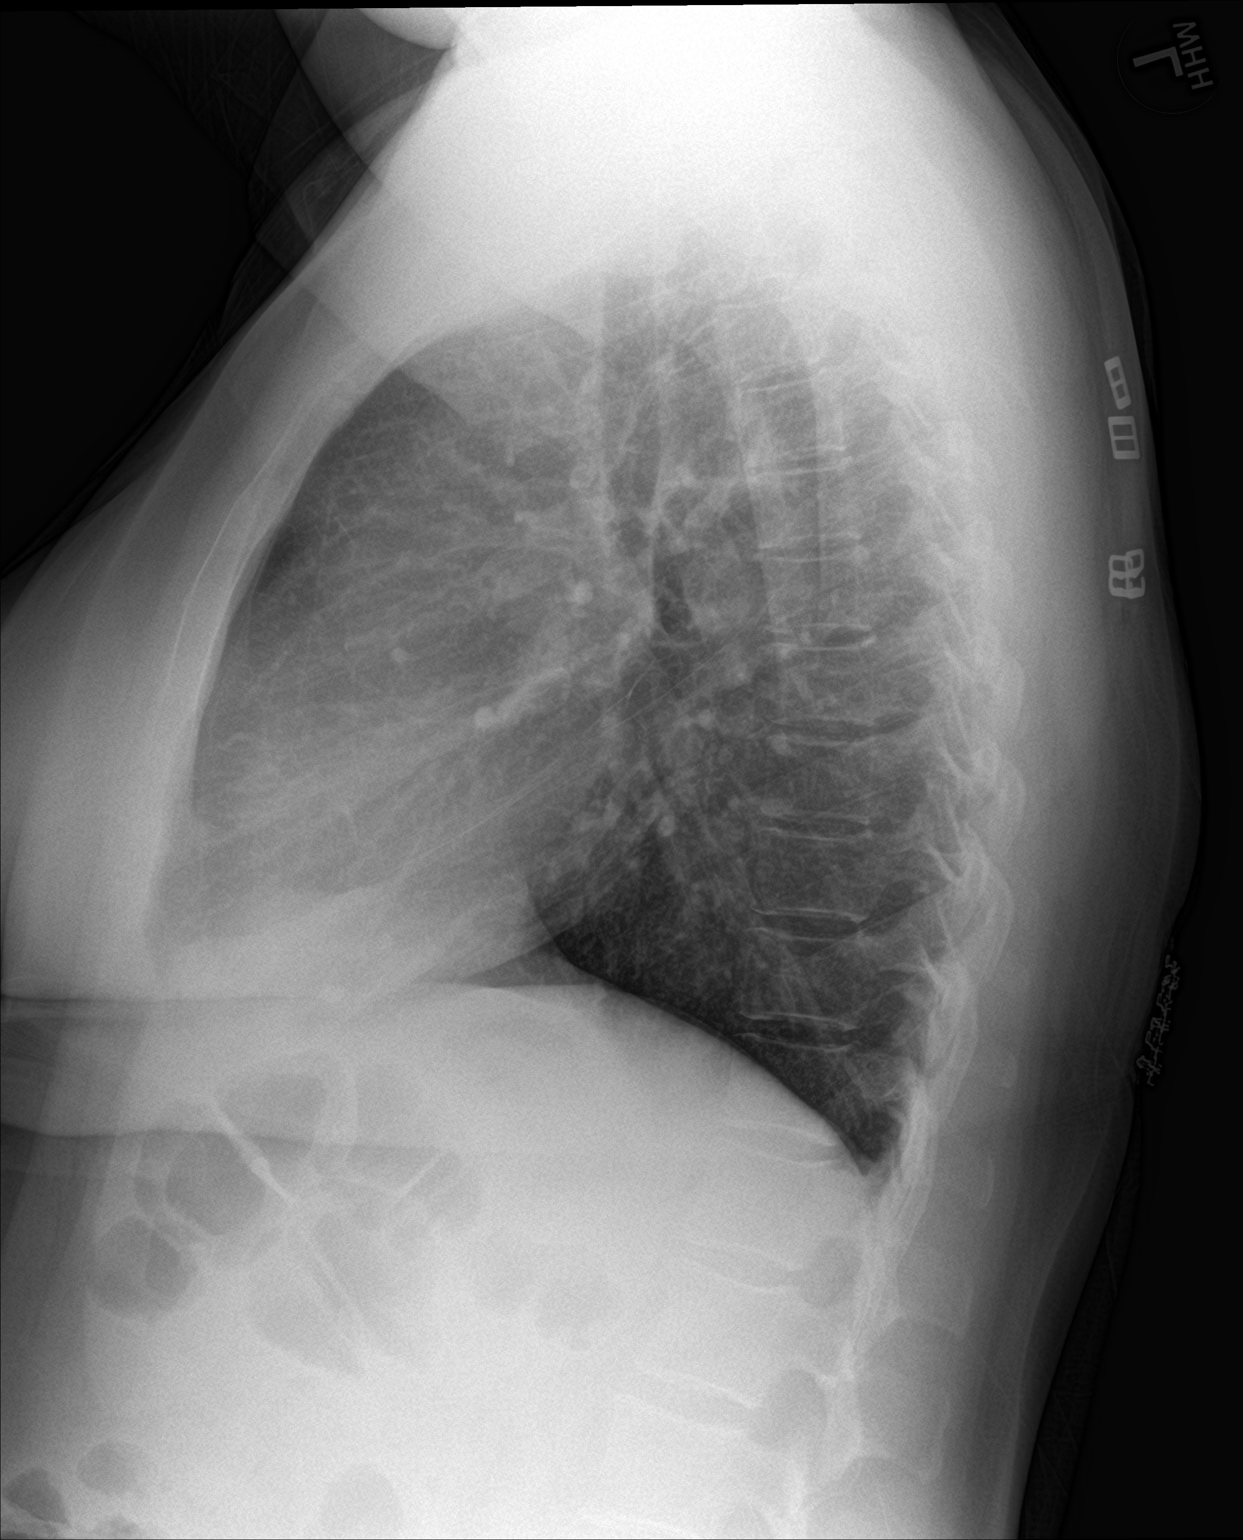

[2 of 2 positions shown; findings below may reference images not displayed]

FINDINGS: No focal consolidation or pleural effusion. Heart size upper limits
of normal. No pleural effusion.
IMPRESSION: No focal pulmonary infiltrate.

## 2019-10-09 ENCOUNTER — Other Ambulatory Visit: Payer: Self-pay | Admitting: Family Medicine

## 2019-10-09 DIAGNOSIS — Z1231 Encounter for screening mammogram for malignant neoplasm of breast: Secondary | ICD-10-CM

## 2019-10-18 ENCOUNTER — Ambulatory Visit
Admission: RE | Admit: 2019-10-18 | Discharge: 2019-10-18 | Disposition: A | Discharge status: Home / Self Care | Payer: BLUE CROSS/BLUE SHIELD | Source: Ambulatory Visit | Attending: Family Medicine | Admitting: Family Medicine

## 2019-10-18 ENCOUNTER — Other Ambulatory Visit: Payer: Self-pay

## 2019-10-18 DIAGNOSIS — Z1231 Encounter for screening mammogram for malignant neoplasm of breast: Secondary | ICD-10-CM

## 2019-10-23 ENCOUNTER — Encounter (HOSPITAL_BASED_OUTPATIENT_CLINIC_OR_DEPARTMENT_OTHER): Payer: Self-pay | Admitting: *Deleted

## 2019-10-23 ENCOUNTER — Other Ambulatory Visit: Payer: Self-pay

## 2019-10-23 ENCOUNTER — Emergency Department (HOSPITAL_BASED_OUTPATIENT_CLINIC_OR_DEPARTMENT_OTHER)
Admission: EM | Admit: 2019-10-23 | Discharge: 2019-10-23 | Disposition: A | Discharge status: Home / Self Care | Payer: BC Managed Care – PPO | Attending: Emergency Medicine | Admitting: Emergency Medicine

## 2019-10-23 DIAGNOSIS — Z7982 Long term (current) use of aspirin: Secondary | ICD-10-CM | POA: Insufficient documentation

## 2019-10-23 DIAGNOSIS — Z79899 Other long term (current) drug therapy: Secondary | ICD-10-CM | POA: Insufficient documentation

## 2019-10-23 DIAGNOSIS — R05 Cough: Secondary | ICD-10-CM | POA: Diagnosis present

## 2019-10-23 DIAGNOSIS — E669 Obesity, unspecified: Secondary | ICD-10-CM | POA: Insufficient documentation

## 2019-10-23 DIAGNOSIS — E119 Type 2 diabetes mellitus without complications: Secondary | ICD-10-CM | POA: Diagnosis not present

## 2019-10-23 DIAGNOSIS — I10 Essential (primary) hypertension: Secondary | ICD-10-CM | POA: Insufficient documentation

## 2019-10-23 DIAGNOSIS — Z7984 Long term (current) use of oral hypoglycemic drugs: Secondary | ICD-10-CM | POA: Insufficient documentation

## 2019-10-23 DIAGNOSIS — U071 COVID-19: Secondary | ICD-10-CM | POA: Diagnosis not present

## 2019-10-23 LAB — SARS CORONAVIRUS 2 BY RT PCR (HOSPITAL ORDER, PERFORMED IN ~~LOC~~ HOSPITAL LAB): SARS Coronavirus 2: POSITIVE — AB

## 2019-10-23 NOTE — Discharge Instructions (Addendum)
Home to quarantine.  Follow-up with the infusion clinic, they will contact you to schedule an appointment. Recommend use pulse ox at home to monitor your O2 saturations, return to the ER for worsening or concerning symptoms or for oxygen saturation less than 90%. 

## 2019-10-23 NOTE — ED Provider Notes (Signed)
[facility] EMERGENCY DEPARTMENT Provider Note   CSN: [id number] Arrival date & time: 10/23/19  1212     History No chief complaint on file.   Judith Cain is a 59 y.o. female.  59 year old female presents with complaint of body aches, diarrhea, cough and congestion with fevers.  Patient states her symptoms started yesterday, patient has known Covid exposure, also exposed to husband with similar symptoms.  Patient has not been vaccinated against Covid, is a non-smoker, no history of asthma or chronic lung disease.  Judith Cain was evaluated in Emergency Department on 10/23/2019 for the symptoms described in the history of present illness. She was evaluated in the context of the global COVID-19 pandemic, which necessitated consideration that the patient might be at risk for infection with the SARS-CoV-2 virus that causes COVID-19. Institutional protocols and algorithms that pertain to the evaluation of patients at risk for COVID-19 are in a state of rapid change based on information released by regulatory bodies including the CDC and federal and state organizations. These policies and algorithms were followed during the patient's care in the ED.         Past Medical History:  Diagnosis Date  . Diabetes mellitus without complication (HCC)   . Hypertension   . Thyroid disease     Patient Active Problem List   Diagnosis Date Noted  . Aortic atherosclerosis (HCC) 12/29/2017  . Mixed hyperlipidemia 12/29/2017  . Class 1 obesity due to excess calories with serious comorbidity and body mass index (BMI) of 31.0 to 31.9 in adult 12/29/2017    Past Surgical History:  Procedure Laterality Date  . APPENDECTOMY  2014     OB History   No obstetric history on file.     No family history on file.  Social History   Tobacco Use  . Smoking status: Never Smoker  . Smokeless tobacco: Never Used  Substance Use Topics  . Alcohol use: Never  . Drug use: Never    Home  Medications Prior to Admission medications   Medication Sig Start Date End Date Taking? Authorizing Provider  aspirin 81 MG chewable tablet Chew 81 mg by mouth daily. 05/18/15  Yes [provider]  atorvastatin (LIPITOR) 40 MG tablet Take 40 mg by mouth daily.  06/01/17  Yes [provider]  hydrochlorothiazide (HYDRODIURIL) 25 MG tablet Take 25 mg by mouth daily. 06/01/17  Yes [provider]  IRON PO Take by mouth.   Yes [provider]  levothyroxine (SYNTHROID, LEVOTHROID) 50 MCG tablet Take 50 mcg by mouth daily. 06/01/17  Yes [provider]  losartan (COZAAR) 25 MG tablet Take 25 mg by mouth daily. 02/14/17  Yes [provider]  metFORMIN (GLUCOPHAGE) 500 MG tablet Take 500 mg by mouth 2 (two) times daily. 06/01/17  Yes [provider]    Allergies    Patient has no known allergies.  Review of Systems   Review of Systems  Constitutional: Positive for fever.  HENT: Positive for congestion. Negative for sore throat.   Respiratory: Positive for cough. Negative for shortness of breath.   Cardiovascular: Negative for chest pain.  Gastrointestinal: Positive for diarrhea. Negative for abdominal pain, nausea and vomiting.  Musculoskeletal: Positive for arthralgias and myalgias.  Skin: Negative for rash and wound.  Allergic/Immunologic: Positive for immunocompromised state.  Neurological: Negative for weakness.  Hematological: Negative for adenopathy.  Psychiatric/Behavioral: Negative for confusion.  All other systems reviewed and are negative.   Physical Exam Updated Vital  Signs BP 117/77   Pulse 97   Temp 98.5 F (36.9 C) (Oral)   Resp 20   Ht 5\' 7"  (1.702 m)   Wt 84.4 kg   SpO2 99%   BMI 29.13 kg/m   Physical Exam Vitals and nursing note reviewed.  Constitutional:      General: She is not in acute distress.    Appearance: She is well-developed. She is obese. She is not diaphoretic.  HENT:     Head: Normocephalic  and atraumatic.  Eyes:     Conjunctiva/sclera: Conjunctivae normal.  Cardiovascular:     Rate and Rhythm: Normal rate and regular rhythm.     Heart sounds: Normal heart sounds.  Pulmonary:     Effort: Pulmonary effort is normal.     Breath sounds: Normal breath sounds.  Musculoskeletal:     Cervical back: Neck supple.  Lymphadenopathy:     Cervical: No cervical adenopathy.  Skin:    General: Skin is warm and dry.  Neurological:     Mental Status: She is alert and oriented to person, place, and time.  Psychiatric:        Behavior: Behavior normal.     ED Results / Procedures / Treatments   Labs (all labs ordered are listed, but only abnormal results are displayed) Labs Reviewed  SARS CORONAVIRUS 2 BY RT PCR (HOSPITAL ORDER, PERFORMED IN [facility]) - Abnormal; Notable for the following components:      Result Value   SARS Coronavirus 2 POSITIVE (*)    All other components within normal limits    EKG None  Radiology No results found.  Procedures Procedures (including critical care time)  Medications Ordered in ED Medications - No data to display  ED Course  I have reviewed the triage vital signs and the nursing notes.  Pertinent labs & imaging results that were available during my care of the patient were reviewed by me and considered in my medical decision making (see chart for details).  Clinical Course as of Oct 22 1352  Tue Oct 23, 2019  3143 59 year old female presents with Covid-like illness onset yesterday with known Covid exposure.  Patient has tested positive for Covid today.  Discussed results with patient and her spouse, plan is for treatment at the infusion center, discussed with infusion center who will contact patient to schedule an appointment.  Patient qualifies based on history of diabetes as well as her BMI greater than 25.  Recommend Mucinex and Tylenol, recommend using home pulse ox to monitor O2 saturations at home, return to ER for  severe concerning symptoms otherwise follow-up with PCP.   [LM]    Clinical Course User Index [LM] 58   MDM Rules/Calculators/A&P                          Final Clinical Impression(s) / ED Diagnoses Final diagnoses:  COVID-19    Rx / DC Orders ED Discharge Orders    None       Alden Hipp, PA-C 10/23/19 1354    10/25/19, MD 10/23/19 1525

## 2019-10-23 NOTE — ED Triage Notes (Signed)
Covid exposure. Body aches, headache, fever, diarrhea, cough chills and fatigue.

## 2019-10-23 NOTE — ED Notes (Signed)
ED Provider at bedside. 

## 2019-10-25 ENCOUNTER — Telehealth (HOSPITAL_COMMUNITY): Payer: Self-pay | Admitting: Oncology

## 2019-10-25 NOTE — Telephone Encounter (Signed)
Called to Discuss with patient about Covid symptoms and the use of regeneron, a monoclonal antibody infusion for those with mild to moderate Covid symptoms and at a high risk of hospitalization.     Pt is qualified for this infusion at the [facility] center due to co-morbid conditions and/or a member of an at-risk group.     Unable to reach pt. Left message to return call  Jenny Dianna Deshler , AGNP-C [phone] (Infusion Center Hotline)  

## 2023-01-13 ENCOUNTER — Encounter: Payer: Self-pay | Admitting: Family Medicine

## 2023-01-13 ENCOUNTER — Ambulatory Visit: Payer: BC Managed Care – PPO | Admitting: Family Medicine

## 2023-01-13 VITALS — BP 121/73 | HR 69 | Temp 98.2°F | Ht 67.0 in | Wt 192.0 lb

## 2023-01-13 DIAGNOSIS — E1159 Type 2 diabetes mellitus with other circulatory complications: Secondary | ICD-10-CM

## 2023-01-13 DIAGNOSIS — E039 Hypothyroidism, unspecified: Secondary | ICD-10-CM | POA: Diagnosis not present

## 2023-01-13 DIAGNOSIS — Z23 Encounter for immunization: Secondary | ICD-10-CM | POA: Diagnosis not present

## 2023-01-13 DIAGNOSIS — F411 Generalized anxiety disorder: Secondary | ICD-10-CM

## 2023-01-13 DIAGNOSIS — Z7985 Long-term (current) use of injectable non-insulin antidiabetic drugs: Secondary | ICD-10-CM | POA: Diagnosis not present

## 2023-01-13 DIAGNOSIS — Z7984 Long term (current) use of oral hypoglycemic drugs: Secondary | ICD-10-CM

## 2023-01-13 DIAGNOSIS — F320 Major depressive disorder, single episode, mild: Secondary | ICD-10-CM

## 2023-01-13 DIAGNOSIS — I152 Hypertension secondary to endocrine disorders: Secondary | ICD-10-CM

## 2023-01-13 DIAGNOSIS — N393 Stress incontinence (female) (male): Secondary | ICD-10-CM

## 2023-01-13 DIAGNOSIS — E785 Hyperlipidemia, unspecified: Secondary | ICD-10-CM

## 2023-01-13 DIAGNOSIS — E1169 Type 2 diabetes mellitus with other specified complication: Secondary | ICD-10-CM | POA: Diagnosis not present

## 2023-01-13 DIAGNOSIS — E119 Type 2 diabetes mellitus without complications: Secondary | ICD-10-CM

## 2023-01-13 DIAGNOSIS — E559 Vitamin D deficiency, unspecified: Secondary | ICD-10-CM

## 2023-01-13 LAB — BAYER DCA HB A1C WAIVED: HB A1C (BAYER DCA - WAIVED): 9.1 % — ABNORMAL HIGH (ref 4.8–5.6)

## 2023-01-13 MED ORDER — TRULICITY 0.75 MG/0.5ML ~~LOC~~ SOAJ: 0.7500 mg | SUBCUTANEOUS | 0 refills | Status: AC

## 2023-01-13 MED ORDER — TRULICITY 1.5 MG/0.5ML ~~LOC~~ SOAJ: 1.5000 mg | SUBCUTANEOUS | 0 refills | Status: AC

## 2023-01-13 MED ORDER — HYDROXYZINE PAMOATE 25 MG PO CAPS: 25.0000 mg | ORAL_CAPSULE | Freq: Three times a day (TID) | ORAL | 0 refills | Status: DC | PRN

## 2023-01-13 MED ORDER — TRULICITY 3 MG/0.5ML ~~LOC~~ SOAJ: 3.0000 mg | SUBCUTANEOUS | 0 refills | Status: AC

## 2023-01-13 NOTE — Progress Notes (Signed)
New Patient Office Visit  Subjective   Patient ID: Judith Cain, female    DOB: 1960-12-20  Age: 62 y.o. MRN: [id number]  CC:  Chief Complaint  Patient presents with   New Patient (Initial Visit)    Med mgmt chronic conditions Diabetes Hypertension hyperlipidemia   HPI Judith Cain presents to establish care States that she was a previously a patient of [facility] in [place]. Due to an insurance change she is needed a new provider. She reports a history of Vitamin D Deficiency, Hypertension, T2DM, HLD, Hypothyroidism, stress incontinence and anxiety. States that the only medication she is out of is Science writer.   Vitamin D deficiency Taking once weekly vitamin. Denies any symptoms.   Hypertension associated with diabetes (HCC) Has BP monitor at home No ROS Denies anxiety, fatigue, peripheral edema, changes to vision, chest pain, headaches, palpitations, sweats, SOB, PND, orthopnea Meds losartan, hydrochlorothiazide  CAD risks Diabetes Mellitus, hypertension, hypercholesterolemia/hyperlipidemia  Hyperlipidemia associated with type 2 diabetes mellitus (HCC) Lipid/Cholesterol, Follow-up  Last lipid panel Other pertinent labs  No results found for: "CHOL", "HDL", "LDLCALC", "LDLDIRECT", "TRIG", "CHOLHDL" Lab Results  Component Value Date   ALT 22 06/19/2017   AST 18 06/19/2017   PLT 272 06/19/2017     She was last seen for this 1 years ago.  Management since that visit includes lipitor.  She reports good compliance with treatment. She is not having side effects.   Symptoms: No chest pain No chest pressure/discomfort  No dyspnea No lower extremity edema  Yes numbness or tingling of extremity No orthopnea  No palpitations No paroxysmal nocturnal dyspnea  No speech difficulty No syncope    The 10-year ASCVD risk score (Arnett DK, et al., 2019) is: 10.2%  ---------------------------------------------------------------------------------------------------  Type  2 diabetes mellitus with other specified complication, without long-term current use of insulin (HCC) Glucometer: reli-on   High at home: states that  236; Low at home: 105, Taking medication(s): metformin, glipizide, Trulicity  Last eye exam: due states that she goes to [employer]  Last foot exam: due  Last A1c: 7.7%  Nephropathy screen indicated?: due  Last flu, zoster and/or pneumovax:  Immunization History  Administered Date(s) Administered   Hep A, Unspecified 06/10/2014   Hepatitis A, Ped/Adol-2 Dose 09/12/2013, 03/04/2014   Hepatitis B, PED/ADOLESCENT 09/12/2013, 03/04/2014, 06/10/2014   Influenza, Quadrivalent, Recombinant, Inj, Pf 12/02/2016, 12/20/2016, 11/09/2018   Influenza,inj,Quad PF,6+ Mos 12/04/2015, 12/01/2017, 12/16/2020, 11/04/2021   Influenza-Unspecified 02/25/2014, 12/19/2014   Pneumococcal Polysaccharide-23 09/12/2013, 10/16/2020   Tdap 09/05/2012   Zoster Recombinant(Shingrix) 10/09/2019, 12/12/2019   ROS: Denies dizziness, LOC, polyuria, polydipsia, unintended weight loss/gain, foot ulcerations,  shortness of breath or chest pain. numbness or tingling in extremities   Stress incontinence, female States that this continues with every time she makes a sudden move. States she has tried PT in the past and it did not improve her symptoms. She has not followed up with uro/gyn.   Acquired hypothyroidism Thyroid: Patient presents for evaluation of hypothyroidism. Current symptoms include constipation, anxiousness. Patient denies change in skin,  nails, or hair, heat intolerance, goiter, ocular symptoms.   Depression/Anxiety  States that her husband passed away 3 years ago and she has not had motivation or energy since. States that she gets nervous going places and worried that she will get lost. Therefore, she mainly goes to work and home. She does not wish to start counseling. Denies SI. She does not want to start a daily medication at this time. She  would like  something to help her through her moments of anxiety.   Outpatient Encounter Medications as of 01/13/2023  Medication Sig   aspirin 81 MG chewable tablet Chew 81 mg by mouth daily.   atorvastatin (LIPITOR) 40 MG tablet Take 40 mg by mouth daily.    Blood Glucose Monitoring Suppl (FREESTYLE FREEDOM) KIT by Other route.   Ferrous Sulfate (IRON PO) Take by mouth.   glipiZIDE (GLUCOTROL XL) 10 MG 24 hr tablet Take one pill per day   hydrochlorothiazide (HYDRODIURIL) 25 MG tablet Take 25 mg by mouth daily.   IRON PO Take by mouth.   levothyroxine (SYNTHROID, LEVOTHROID) 50 MCG tablet Take 50 mcg by mouth daily.   losartan (COZAAR) 25 MG tablet Take 25 mg by mouth daily.   metFORMIN (GLUCOPHAGE) 500 MG tablet Take 500 mg by mouth 2 (two) times daily.   TRULICITY 1.5 MG/0.5ML SOAJ INJECT 1.5 MG SUBCUTANEOUSLY ONCE A WEEK   Vitamin D, Ergocalciferol, (DRISDOL) 1.25 MG (50000 UNIT) CAPS capsule Take 1 capsule by mouth once a week.   tirzepatide (MOUNJARO) 2.5 MG/0.5ML Pen INJECT 0.5 ML (2.5MG  TOTAL) SUBCUTANEOUSLY  ONCE A WEEK FOR DIABETES (Patient not taking: Reported on 01/13/2023)   No facility-administered encounter medications on file as of 01/13/2023.    Past Medical History:  Diagnosis Date   Diabetes mellitus without complication (HCC)    Hypertension    Thyroid disease    Past Surgical History:  Procedure Laterality Date   APPENDECTOMY  2014    History reviewed. No pertinent family history.  Social History   Socioeconomic History   Marital status: Widowed    Spouse name: Not on file   Number of children: Not on file   Years of education: Not on file   Highest education level: Not on file  Occupational History   Not on file  Tobacco Use   Smoking status: Never   Smokeless tobacco: Never  Substance and Sexual Activity   Alcohol use: Never   Drug use: Never   Sexual activity: Never  Other Topics Concern   Not on file  Social History Narrative   Not on file    Social Determinants of Health   Financial Resource Strain: Not on file  Food Insecurity: Not on file  Transportation Needs: Not on file  Physical Activity: Not on file  Stress: Not on file  Social Connections: Not on file  Intimate Partner Violence: Not on file    ROS As per HPI  Objective   BP 121/73   Pulse 69   Temp 98.2 F (36.8 C)   Ht 5\' 7"  (1.702 m)   Wt 192 lb (87.1 kg)   SpO2 97%   BMI 30.07 kg/m   Physical Exam Constitutional:      General: She is awake. She is not in acute distress.    Appearance: Normal appearance. She is well-developed and well-groomed. She is not ill-appearing, toxic-appearing or diaphoretic.  Cardiovascular:     Rate and Rhythm: Normal rate and regular rhythm.     Pulses: Normal pulses.          Radial pulses are 2+ on the right side and 2+ on the left side.       Posterior tibial pulses are 2+ on the right side and 2+ on the left side.     Heart sounds: Normal heart sounds. No murmur heard.    No gallop.  Pulmonary:     Effort: Pulmonary effort is normal. No  respiratory distress.     Breath sounds: Normal breath sounds. No stridor. No wheezing, rhonchi or rales.  Musculoskeletal:     Cervical back: Full passive range of motion without pain and neck supple.     Right lower leg: No edema.     Left lower leg: No edema.     Right foot: Normal range of motion. No deformity, bunion, Charcot foot, foot drop or prominent metatarsal heads.     Left foot: Normal range of motion. No deformity, bunion, Charcot foot, foot drop or prominent metatarsal heads.  Feet:     Right foot:     Protective Sensation: 10 sites tested.  9 sites sensed.     Skin integrity: No ulcer, blister, skin breakdown, erythema, warmth, callus, dry skin or fissure.     Toenail Condition: Right toenails are abnormally thick and long. Fungal disease present.    Left foot:     Protective Sensation: 10 sites tested.  9 sites sensed.     Skin integrity: No ulcer, blister,  skin breakdown, erythema, warmth, callus, dry skin or fissure.     Toenail Condition: Left toenails are abnormally thick and long. Fungal disease present. Skin:    General: Skin is warm.     Capillary Refill: Capillary refill takes less than 2 seconds.  Neurological:     General: No focal deficit present.     Mental Status: She is alert, oriented to person, place, and time and easily aroused. Mental status is at baseline.     GCS: GCS eye subscore is 4. GCS verbal subscore is 5. GCS motor subscore is 6.     Motor: No weakness.  Psychiatric:        Attention and Perception: Attention and perception normal.        Mood and Affect: Mood and affect normal.        Speech: Speech normal.        Behavior: Behavior normal. Behavior is cooperative.        Thought Content: Thought content normal. Thought content does not include homicidal or suicidal ideation. Thought content does not include homicidal or suicidal plan.        Cognition and Memory: Cognition and memory normal.        Judgment: Judgment normal.       01/13/2023    8:48 AM  Depression screen PHQ 2/9  Decreased Interest 0  Down, Depressed, Hopeless 0  PHQ - 2 Score 0  Altered sleeping 3  Tired, decreased energy 3  Change in appetite 0  Feeling bad or failure about yourself  0  Trouble concentrating 0  Moving slowly or fidgety/restless 0  Suicidal thoughts 0  PHQ-9 Score 6  Difficult doing work/chores Not difficult at all      01/13/2023    8:48 AM  Depression screen PHQ 2/9  Decreased Interest 0  Down, Depressed, Hopeless 0  PHQ - 2 Score 0  Altered sleeping 3  Tired, decreased energy 3  Change in appetite 0  Feeling bad or failure about yourself  0  Trouble concentrating 0  Moving slowly or fidgety/restless 0  Suicidal thoughts 0  PHQ-9 Score 6  Difficult doing work/chores Not difficult at all   Assessment & Plan:  1. Hypertension associated with diabetes (HCC) Well controlled on current regimen. Patient  would like to decrease pill intake. Will discontinue hydrochlorothiazide today as patient denies symptoms of edema. Will order BP monitor for patient to measure BP at home.  Patient to record BP daily for evaluation s/p discontinuing hydrochlorothiazide. Labs as below. Will communicate results to patient once available. Will await results to determine next steps.  Reviewed notes from [facility] [place] 10/21/21. Reviewed labs from 10/21/21 in Care Everywhere.  - CMP14+EGFR - CBC with Differential/Platelet - For home use only DME Other see comment  2. Hyperlipidemia associated with type 2 diabetes mellitus (HCC) Labs as below. Will communicate results to patient once available. Will await results to determine next steps.  Fasting unknown.  Patient to continue on Lipitor.  Reviewed notes from [facility] [place] 10/21/21. Reviewed labs from 10/21/21 in Care Everywhere.  - Lipid panel  3. Type 2 diabetes mellitus with other specified complication, without long-term current use of insulin (HCC) A1C not at goal in office today. Patient to continue on metformin and glipizide. Will restart Trulicity as below. Patient to start at low dose as she has been out of medication for 2 weeks. Discussed side effects with patient. Will consider increasing metformin to 1000 mg BID.  Labs as below. Will communicate results to patient once available. Will await results to determine next steps.  Reviewed notes from [facility] [place] 10/21/21. Reviewed labs from 10/21/21 in Care Everywhere.  - glipiZIDE (GLUCOTROL XL) 10 MG 24 hr tablet; Take one pill per day - Microalbumin / creatinine urine ratio - Bayer DCA Hb A1c Waived - Thyroid Panel With TSH - Vitamin B12 - TRULICITY 1.5 MG/0.5ML SOAJ; Inject 1.5 mg into the skin once a week for 4 doses.  Dispense: 2 mL; Refill: 0 - Dulaglutide (TRULICITY) 0.75 MG/0.5ML  SOAJ; Inject 0.75 mg into the skin once a week for 4 doses.  Dispense: 2 mL; Refill: 0 - Dulaglutide (TRULICITY) 3 MG/0.5ML SOAJ; Inject 3 mg as directed once a week for 4 doses.  Dispense: 2 mL; Refill: 0  4. Diabetes mellitus treated with oral medication (HCC) As above. Labs as below. Will communicate results to patient once available. Will await results to determine next steps.  Reviewed notes from [facility] [place] 10/21/21. Reviewed labs from 10/21/21 in Care Everywhere.  - Vitamin B12  5. Long-term current use of injectable noninsulin antidiabetic medication As above. Labs as below. Will communicate results to patient once available. Will await results to determine next steps.  Reviewed notes from [facility] [place] 10/21/21. Reviewed labs from 10/21/21 in Care Everywhere.  - Vitamin B12  6. Acquired hypothyroidism Labs as below. Will communicate results to patient once available. Will await results to determine next steps.  Patient to continue current medications.  Reviewed notes from [facility] [place] 10/21/21. Reviewed labs from 10/21/21 in Care Everywhere.  - Thyroid Panel With TSH  7. Vitamin D deficiency Continue current medications. Labs as below. Will communicate results to patient once available. Will await results to determine next steps.  Reviewed notes from [facility] [place] 10/21/21. Reviewed labs from 10/21/21 in Care Everywhere.  - Vitamin D, Ergocalciferol, (DRISDOL) 1.25 MG (50000 UNIT) CAPS capsule; Take 1 capsule by mouth once a week.  8. Stress incontinence, female Declines referral at this time. Patient to follow up if she would like to pursue treatment.  Reviewed notes from [facility] [place] 10/21/21. Reviewed labs from 10/21/21 in Care Everywhere.   9. Depression,  major, single  episode, mild (HCC) Pt screened positive for depression today. Pt offered nonpharmacologic and pharmacologic therapy. Pt declined initiating treatment at this time. Safety contract established today with patient in clinic. Denies intent to harm herself or others. Pt to notify provider if they would like to initiate treatment. Reviewed notes from [facility] [place] 10/21/21. Reviewed labs from 10/21/21 in Care Everywhere.   10. GAD (generalized anxiety disorder) Pt screened positive for anxiety today. Pt offered nonpharmacologic and pharmacologic therapy. Pt declined initiating counseling at this time. Safety contract established today with patient in clinic. Denies intent to harm herself or others. Patient agreeable to start as needed medication for times of anxiety. Discussed side effects with patient. Reviewed notes from [facility] [place] 10/21/21. Reviewed labs from 10/21/21 in Care Everywhere.  - hydrOXYzine (VISTARIL) 25 MG capsule; Take 1 capsule (25 mg total) by mouth every 8 (eight) hours as needed.  Dispense: 30 capsule; Refill: 0  11. Encounter for immunization - Flu vaccine trivalent PF, 6mos and older(Flulaval,Afluria,Fluarix,Fluzone)  The above assessment and management plan was discussed with the patient. The patient verbalized understanding of and has agreed to the management plan using shared-decision making. Patient is aware to call the clinic if they develop any new symptoms or if symptoms fail to improve or worsen. Patient is aware when to return to the clinic for a follow-up visit. Patient educated on when it is appropriate to go to the emergency department.   Return in about 3 months (around 04/15/2023) for Chronic Condition Follow up.   Neale Burly, DNP-FNP [facility] [address] [place], [place] [address] [phone]

## 2023-01-14 LAB — LIPID PANEL
Chol/HDL Ratio: 5.7 ratio — ABNORMAL HIGH (ref 0.0–4.4)
Cholesterol, Total: 200 mg/dL — ABNORMAL HIGH (ref 100–199)
HDL: 35 mg/dL — ABNORMAL LOW (ref >39)
LDL Chol Calc (NIH): 123 mg/dL — ABNORMAL HIGH (ref 0–99)
Triglycerides: 239 mg/dL — ABNORMAL HIGH (ref 0–149)
VLDL Cholesterol Cal: 42 mg/dL — ABNORMAL HIGH (ref 5–40)

## 2023-01-14 LAB — CMP14+EGFR
ALT: 65 [IU]/L — ABNORMAL HIGH (ref 0–32)
AST: 48 [IU]/L — ABNORMAL HIGH (ref 0–40)
Albumin: 4.6 g/dL (ref 3.8–4.9)
Alkaline Phosphatase: 119 [IU]/L (ref 44–121)
BUN/Creatinine Ratio: 18 (ref 12–28)
BUN: 14 mg/dL (ref 8–27)
Bilirubin Total: 0.8 mg/dL (ref 0.0–1.2)
CO2: 24 mmol/L (ref 20–29)
Calcium: 10.3 mg/dL (ref 8.7–10.3)
Chloride: 99 mmol/L (ref 96–106)
Creatinine, Ser: 0.8 mg/dL (ref 0.57–1.00)
Globulin, Total: 2.4 g/dL (ref 1.5–4.5)
Glucose: 198 mg/dL — ABNORMAL HIGH (ref 70–99)
Potassium: 4.7 mmol/L (ref 3.5–5.2)
Sodium: 138 mmol/L (ref 134–144)
Total Protein: 7 g/dL (ref 6.0–8.5)
eGFR: 84 mL/min/{1.73_m2} (ref >59)

## 2023-01-14 LAB — CBC WITH DIFFERENTIAL/PLATELET
Basophils Absolute: 0 10*3/uL (ref 0.0–0.2)
Basos: 1 %
EOS (ABSOLUTE): 0.2 10*3/uL (ref 0.0–0.4)
Eos: 3 %
Hematocrit: 42.1 % (ref 34.0–46.6)
Hemoglobin: 13.8 g/dL (ref 11.1–15.9)
Immature Grans (Abs): 0 10*3/uL (ref 0.0–0.1)
Immature Granulocytes: 1 %
Lymphocytes Absolute: 2.1 10*3/uL (ref 0.7–3.1)
Lymphs: 37 %
MCH: 29.1 pg (ref 26.6–33.0)
MCHC: 32.8 g/dL (ref 31.5–35.7)
MCV: 89 fL (ref 79–97)
Monocytes Absolute: 0.4 10*3/uL (ref 0.1–0.9)
Monocytes: 8 %
Neutrophils Absolute: 2.8 10*3/uL (ref 1.4–7.0)
Neutrophils: 50 %
Platelets: 201 10*3/uL (ref 150–450)
RBC: 4.74 x10E6/uL (ref 3.77–5.28)
RDW: 12.8 % (ref 11.7–15.4)
WBC: 5.6 10*3/uL (ref 3.4–10.8)

## 2023-01-14 LAB — MICROALBUMIN / CREATININE URINE RATIO
Creatinine, Urine: 46.4 mg/dL
Microalb/Creat Ratio: 16 mg/g{creat} (ref 0–29)
Microalbumin, Urine: 7.5 ug/mL

## 2023-01-14 LAB — THYROID PANEL WITH TSH
Free Thyroxine Index: 2.4 (ref 1.2–4.9)
T3 Uptake Ratio: 23 % — ABNORMAL LOW (ref 24–39)
T4, Total: 10.5 ug/dL (ref 4.5–12.0)
TSH: 1.86 u[IU]/mL (ref 0.450–4.500)

## 2023-01-14 LAB — VITAMIN B12: Vitamin B-12: 419 pg/mL (ref 232–1245)

## 2023-01-14 NOTE — Progress Notes (Signed)
Slight variation on T3 uptake ratio, not concerning at this time. Elevated BG consistent with A1C. Recommend that patient attempt to increase metformin dose. Start with 500 mg twice daily for one week. Then start 1000 mg in the morning and 500 mg in the evening for 1-2 weeks, then increase to 1000 twice daily.  Cholesterol is elevated. Diet encouraged - increase intake of fresh fruits and vegetables, increase intake of lean proteins. Bake, broil, or grill foods. Avoid fried, greasy, and fatty foods. Avoid fast foods. Increase intake of fiber-rich whole grains. Exercise encouraged - at least 150 minutes per week and advance as tolerated. Based on ASCVD risk score, would like patient to start lipitor 80 mg. Will continue to monitor liver enzymes.   The 10-year ASCVD risk score (Arnett DK, et al., 2019) is: 10.1%   Values used to calculate the score:     Age: 62 years     Sex: Female     Is Non-Hispanic African American: No     Diabetic: Yes     Tobacco smoker: No     Systolic Blood Pressure: 121 mmHg     Is BP treated: Yes     HDL Cholesterol: 35 mg/dL     Total Cholesterol: 200 mg/dL

## 2023-01-19 ENCOUNTER — Other Ambulatory Visit: Payer: Self-pay | Admitting: Family Medicine

## 2023-01-19 MED ORDER — ATORVASTATIN CALCIUM 80 MG PO TABS: 80.0000 mg | ORAL_TABLET | Freq: Every day | ORAL | 3 refills | Status: AC

## 2023-04-15 ENCOUNTER — Ambulatory Visit: Payer: BC Managed Care – PPO | Admitting: Family Medicine

## 2023-04-22 ENCOUNTER — Ambulatory Visit: Payer: BC Managed Care – PPO | Admitting: Family Medicine

## 2023-04-22 VITALS — BP 125/74 | HR 65 | Temp 97.8°F | Ht 67.0 in | Wt 186.6 lb

## 2023-04-22 DIAGNOSIS — E559 Vitamin D deficiency, unspecified: Secondary | ICD-10-CM | POA: Diagnosis not present

## 2023-04-22 DIAGNOSIS — Z1331 Encounter for screening for depression: Secondary | ICD-10-CM

## 2023-04-22 DIAGNOSIS — E1169 Type 2 diabetes mellitus with other specified complication: Secondary | ICD-10-CM | POA: Diagnosis not present

## 2023-04-22 DIAGNOSIS — E785 Hyperlipidemia, unspecified: Secondary | ICD-10-CM

## 2023-04-22 DIAGNOSIS — E039 Hypothyroidism, unspecified: Secondary | ICD-10-CM

## 2023-04-22 DIAGNOSIS — E119 Type 2 diabetes mellitus without complications: Secondary | ICD-10-CM | POA: Insufficient documentation

## 2023-04-22 DIAGNOSIS — E1159 Type 2 diabetes mellitus with other circulatory complications: Secondary | ICD-10-CM | POA: Diagnosis not present

## 2023-04-22 DIAGNOSIS — Z7984 Long term (current) use of oral hypoglycemic drugs: Secondary | ICD-10-CM

## 2023-04-22 DIAGNOSIS — I152 Hypertension secondary to endocrine disorders: Secondary | ICD-10-CM

## 2023-04-22 DIAGNOSIS — Z7985 Long-term (current) use of injectable non-insulin antidiabetic drugs: Secondary | ICD-10-CM | POA: Insufficient documentation

## 2023-04-22 LAB — LIPID PANEL

## 2023-04-22 LAB — BAYER DCA HB A1C WAIVED: HB A1C (BAYER DCA - WAIVED): 10 % — ABNORMAL HIGH (ref 4.8–5.6)

## 2023-04-22 MED ORDER — TIRZEPATIDE 2.5 MG/0.5ML ~~LOC~~ SOAJ: 2.5000 mg | SUBCUTANEOUS | 0 refills | Status: AC

## 2023-04-22 MED ORDER — TIRZEPATIDE 7.5 MG/0.5ML ~~LOC~~ SOAJ: 7.5000 mg | SUBCUTANEOUS | 0 refills | Status: AC

## 2023-04-22 MED ORDER — TIRZEPATIDE 5 MG/0.5ML ~~LOC~~ SOAJ: 5.0000 mg | SUBCUTANEOUS | 0 refills | Status: AC

## 2023-04-22 NOTE — Progress Notes (Signed)
 Subjective:  Patient ID: Judith Cain, female    DOB: August 06, 1960, 62 y.o.   MRN: [id number]  Patient Care Team: Arrie Senate, FNP as PCP - General (Family Medicine) Jodelle Red, MD as PCP - Cardiology (Cardiology) Michaelle Copas, MD as Referring Physician [facility])   Chief Complaint:  Medical Management of Chronic Issues (3 month follow up /No acute concerns)  HPI: Judith Cain is a 63 y.o. female presenting on 04/22/2023 for Medical Management of Chronic Issues (3 month follow up /No acute concerns)  HPI 1. Type 2 diabetes mellitus with other specified complication, without long-term current use of insulin (HCC)  High at home: 150; Low at home: 100, Taking medication(s): Trulicity, metformin, glipizide  States that she ran out a month ago and would like to switch to [employer].   Last eye exam: due - going Friday  Last foot exam: due in November  Last A1c:  Lab Results  Component Value Date   HGBA1C 9.1 (H) 01/13/2023   Nephropathy screen indicated?: due in November  Last flu, zoster and/or pneumovax:  Immunization History  Administered Date(s) Administered   Hep A, Unspecified 06/10/2014   Hepatitis A, Ped/Adol-2 Dose 09/12/2013, 03/04/2014   Hepatitis B, PED/ADOLESCENT 09/12/2013, 03/04/2014, 06/10/2014   Influenza, Quadrivalent, Recombinant, Inj, Pf 12/02/2016, 12/20/2016, 11/09/2018   Influenza, Seasonal, Injecte, Preservative Fre 01/13/2023   Influenza,inj,Quad PF,6+ Mos 12/04/2015, 12/01/2017, 12/16/2020, 11/04/2021   Influenza-Unspecified 02/25/2014, 12/19/2014   Pneumococcal Polysaccharide-23 09/12/2013, 10/16/2020   Tdap 09/05/2012   Zoster Recombinant(Shingrix) 10/09/2019, 12/12/2019    ROS: Denies dizziness, LOC, polyuria, polydipsia, unintended weight loss/gain, foot ulcerations, shortness of breath or chest pain. Endorses numbness or tingling in extremities - hands only    2. Hypertension associated with diabetes (HCC) Has BP  monitor at home No BP at home average - does not have monitor  ROS Denies anxiety, fatigue, peripheral edema, changes to vision, chest pain, headaches, palpitations, sweats, SOB, PND, orthopnea Meds losartan, no side effects   CAD risks Diabetes Mellitus, hypertension, hypercholesterolemia/hyperlipidemia  3. Hyperlipidemia associated with type 2 diabetes mellitus (HCC) Lipid/Cholesterol, Follow-up  Last lipid panel Other pertinent labs  Lab Results  Component Value Date   CHOL 200 (H) 01/13/2023   HDL 35 (L) 01/13/2023   LDLCALC 123 (H) 01/13/2023   TRIG 239 (H) 01/13/2023   CHOLHDL 5.7 (H) 01/13/2023   Lab Results  Component Value Date   ALT 65 (H) 01/13/2023   AST 48 (H) 01/13/2023   PLT 201 01/13/2023   TSH 1.860 01/13/2023     She was last seen for this 3 months ago.  Management since that visit includes lipitor 80 mg.  She reports excellent compliance with treatment. She is not having side effects.   Symptoms: No chest pain No chest pressure/discomfort  No dyspnea No lower extremity edema  Yes numbness or tingling of extremity - only in hands  No orthopnea  No palpitations No paroxysmal nocturnal dyspnea  No speech difficulty No syncope   Current diet:  cut out a lot of fried foods, bread and potatoes. States that she only cooks bacon in air fryer or smoker  Current exercise: walking 30 minutes every other day   The 10-year ASCVD risk score (Arnett DK, et al., 2019) is: 10.1%  4. Acquired hypothyroidism Hypothyroidism: Patient presents for evaluation of thyroid function. Symptoms consist of fatigue, change in skin,  nails, or hair. The problem has been stable.  Previous thyroid studies include TSH, T3 uptake,  and total T4. The hypothyroidism is due to hypothyroidism.  5. Vitamin D deficiency Vitamin D deficiency, follow-up  Lab Results  Component Value Date   CALCIUM 10.3 01/13/2023   CALCIUM 9.5 06/19/2017        Wt Readings from Last 3 Encounters:   01/13/23 192 lb (87.1 kg)  10/23/19 186 lb (84.4 kg)  12/29/17 203 lb (92.1 kg)    She was last seen for vitamin D deficiency 3 months ago.  Management since that visit includes OTC unsure of the dose . She reports excellent compliance with treatment. She is not having side effects.   Symptoms: No change in energy level Yes numbness or tingling  No bone pain No unexplained fracture   --------------------------------------------------------------------------------------------------- 6. Depression/Anxiety  States that she is more nervous and irritated. States that she is more forgetful and it is making her nervous. States that Alzheimer's is on her dad's side of the family.  Denies any thoughts of self harm. Does not wish to start medication or therapy at this time   Relevant past medical, surgical, family, and social history reviewed and updated as indicated.  Allergies and medications reviewed and updated. Data reviewed: Chart in Epic.   Past Medical History:  Diagnosis Date   Diabetes mellitus without complication (HCC)    Hypertension    Thyroid disease     Past Surgical History:  Procedure Laterality Date   APPENDECTOMY  2014    Social History   Socioeconomic History   Marital status: Widowed    Spouse name: Not on file   Number of children: Not on file   Years of education: Not on file   Highest education level: 10th grade  Occupational History   Not on file  Tobacco Use   Smoking status: Never   Smokeless tobacco: Never  Substance and Sexual Activity   Alcohol use: Never   Drug use: Never   Sexual activity: Never  Other Topics Concern   Not on file  Social History Narrative   Not on file   Social Drivers of Health   Financial Resource Strain: Low Risk  (04/22/2023)   Overall Financial Resource Strain (CARDIA)    Difficulty of Paying Living Expenses: Not hard at all  Food Insecurity: No Food Insecurity (04/22/2023)   Hunger Vital Sign    Worried  About Running Out of Food in the Last Year: Never true    Ran Out of Food in the Last Year: Never true  Transportation Needs: No Transportation Needs (04/22/2023)   PRAPARE - Administrator, Civil Service (Medical): No    Lack of Transportation (Non-Medical): No  Physical Activity: Sufficiently Active (04/22/2023)   Exercise Vital Sign    Days of Exercise per Week: 5 days    Minutes of Exercise per Session: 60 min  Stress: No Stress Concern Present (04/22/2023)   [employer] of Occupational Health - Occupational Stress Questionnaire    Feeling of Stress : Only a little  Social Connections: Moderately Isolated (04/22/2023)   Social Connection and Isolation Panel [NHANES]    Frequency of Communication with Friends and Family: More than three times a week    Frequency of Social Gatherings with Friends and Family: More than three times a week    Attends Religious Services: More than 4 times per year    Active Member of [employer] or Organizations: No    Attends Banker Meetings: Not on file    Marital Status: Widowed  Intimate  Partner Violence: Not on file    Outpatient Encounter Medications as of 04/22/2023  Medication Sig   aspirin 81 MG chewable tablet Chew 81 mg by mouth daily.   atorvastatin (LIPITOR) 80 MG tablet Take 1 tablet (80 mg total) by mouth daily.   Blood Glucose Monitoring Suppl (FREESTYLE FREEDOM) KIT by Other route.   Ferrous Sulfate (IRON PO) Take by mouth.   glipiZIDE (GLUCOTROL XL) 10 MG 24 hr tablet Take one pill per day   hydrOXYzine (VISTARIL) 25 MG capsule Take 1 capsule (25 mg total) by mouth every 8 (eight) hours as needed.   IRON PO Take by mouth.   levothyroxine (SYNTHROID, LEVOTHROID) 50 MCG tablet Take 50 mcg by mouth daily.   losartan (COZAAR) 25 MG tablet Take 25 mg by mouth daily.   metFORMIN (GLUCOPHAGE) 500 MG tablet Take 500 mg by mouth 2 (two) times daily.   Vitamin D, Ergocalciferol, (DRISDOL) 1.25 MG (50000 UNIT) CAPS capsule  Take 1 capsule by mouth once a week.   No facility-administered encounter medications on file as of 04/22/2023.    No Known Allergies  Review of Systems As per HPI  Objective:  BP 125/74   Pulse 65   Temp 97.8 F (36.6 C)   Ht 5\' 7"  (1.702 m)   Wt 186 lb 9.6 oz (84.6 kg)   SpO2 97%   BMI 29.23 kg/m    Wt Readings from Last 3 Encounters:  01/13/23 192 lb (87.1 kg)  10/23/19 186 lb (84.4 kg)  12/29/17 203 lb (92.1 kg)   Physical Exam Constitutional:      General: She is awake. She is not in acute distress.    Appearance: Normal appearance. She is well-developed and well-groomed. She is not ill-appearing, toxic-appearing or diaphoretic.  Cardiovascular:     Rate and Rhythm: Normal rate and regular rhythm.     Pulses: Normal pulses.          Radial pulses are 2+ on the right side and 2+ on the left side.       Posterior tibial pulses are 2+ on the right side and 2+ on the left side.     Heart sounds: Normal heart sounds. No murmur heard.    No gallop.  Pulmonary:     Effort: Pulmonary effort is normal. No respiratory distress.     Breath sounds: Normal breath sounds. No stridor. No wheezing, rhonchi or rales.  Musculoskeletal:     Cervical back: Full passive range of motion without pain and neck supple.     Right lower leg: No edema.     Left lower leg: No edema.  Skin:    General: Skin is warm.     Capillary Refill: Capillary refill takes less than 2 seconds.  Neurological:     General: No focal deficit present.     Mental Status: She is alert, oriented to person, place, and time and easily aroused. Mental status is at baseline.     GCS: GCS eye subscore is 4. GCS verbal subscore is 5. GCS motor subscore is 6.     Motor: No weakness.  Psychiatric:        Attention and Perception: Attention and perception normal.        Mood and Affect: Mood and affect normal.        Speech: Speech normal.        Behavior: Behavior normal. Behavior is cooperative.        Thought  Content: Thought content  normal. Thought content does not include homicidal or suicidal ideation. Thought content does not include homicidal or suicidal plan.        Cognition and Memory: Cognition and memory normal.        Judgment: Judgment normal.      Results for orders placed or performed in visit on 01/13/23  Bayer DCA Hb A1c Waived   Collection Time: 01/13/23  9:19 AM  Result Value Ref Range   HB A1C (BAYER DCA - WAIVED) 9.1 (H) 4.8 - 5.6 %  Microalbumin / creatinine urine ratio   Collection Time: 01/13/23  9:22 AM  Result Value Ref Range   Creatinine, Urine 46.4 Not Estab. mg/dL   Microalbumin, Urine 7.5 Not Estab. ug/mL   Microalb/Creat Ratio 16 0 - 29 mg/g creat  Thyroid Panel With TSH   Collection Time: 01/13/23  9:24 AM  Result Value Ref Range   TSH 1.860 0.450 - 4.500 uIU/mL   T4, Total 10.5 4.5 - 12.0 ug/dL   T3 Uptake Ratio 23 (L) 24 - 39 %   Free Thyroxine Index 2.4 1.2 - 4.9  CMP14+EGFR   Collection Time: 01/13/23  9:24 AM  Result Value Ref Range   Glucose 198 (H) 70 - 99 mg/dL   BUN 14 8 - 27 mg/dL   Creatinine, Ser 1.61 0.57 - 1.00 mg/dL   eGFR 84 >09 UE/AVW/0.98   BUN/Creatinine Ratio 18 12 - 28   Sodium 138 134 - 144 mmol/L   Potassium 4.7 3.5 - 5.2 mmol/L   Chloride 99 96 - 106 mmol/L   CO2 24 20 - 29 mmol/L   Calcium 10.3 8.7 - 10.3 mg/dL   Total Protein 7.0 6.0 - 8.5 g/dL   Albumin 4.6 3.8 - 4.9 g/dL   Globulin, Total 2.4 1.5 - 4.5 g/dL   Bilirubin Total 0.8 0.0 - 1.2 mg/dL   Alkaline Phosphatase 119 44 - 121 IU/L   AST 48 (H) 0 - 40 IU/L   ALT 65 (H) 0 - 32 IU/L  CBC with Differential/Platelet   Collection Time: 01/13/23  9:24 AM  Result Value Ref Range   WBC 5.6 3.4 - 10.8 x10E3/uL   RBC 4.74 3.77 - 5.28 x10E6/uL   Hemoglobin 13.8 11.1 - 15.9 g/dL   Hematocrit 11.9 14.7 - 46.6 %   MCV 89 79 - 97 fL   MCH 29.1 26.6 - 33.0 pg   MCHC 32.8 31.5 - 35.7 g/dL   RDW 82.9 56.2 - 13.0 %   Platelets 201 150 - 450 x10E3/uL   Neutrophils 50 Not  Estab. %   Lymphs 37 Not Estab. %   Monocytes 8 Not Estab. %   Eos 3 Not Estab. %   Basos 1 Not Estab. %   Neutrophils Absolute 2.8 1.4 - 7.0 x10E3/uL   Lymphocytes Absolute 2.1 0.7 - 3.1 x10E3/uL   Monocytes Absolute 0.4 0.1 - 0.9 x10E3/uL   EOS (ABSOLUTE) 0.2 0.0 - 0.4 x10E3/uL   Basophils Absolute 0.0 0.0 - 0.2 x10E3/uL   Immature Granulocytes 1 Not Estab. %   Immature Grans (Abs) 0.0 0.0 - 0.1 x10E3/uL  Lipid panel   Collection Time: 01/13/23  9:24 AM  Result Value Ref Range   Cholesterol, Total 200 (H) 100 - 199 mg/dL   Triglycerides 865 (H) 0 - 149 mg/dL   HDL 35 (L) >78 mg/dL   VLDL Cholesterol Cal 42 (H) 5 - 40 mg/dL   LDL Chol Calc (NIH) 469 (H) 0 - 99  mg/dL   Chol/HDL Ratio 5.7 (H) 0.0 - 4.4 ratio  Vitamin B12   Collection Time: 01/13/23  9:24 AM  Result Value Ref Range   Vitamin B-12 419 232 - 1,245 pg/mL       04/22/2023   10:37 AM 01/13/2023    8:48 AM  Depression screen PHQ 2/9  Decreased Interest 0 0  Down, Depressed, Hopeless 0 0  PHQ - 2 Score 0 0  Altered sleeping 1 3  Tired, decreased energy 2 3  Change in appetite 0 0  Feeling bad or failure about yourself  0 0  Trouble concentrating 3 0  Moving slowly or fidgety/restless 0 0  Suicidal thoughts 0 0  PHQ-9 Score 6 6  Difficult doing work/chores Not difficult at all Not difficult at all       04/22/2023   10:37 AM 01/13/2023    8:49 AM  GAD 7 : Generalized Anxiety Score  Nervous, Anxious, on Edge 0 2  Control/stop worrying 1 2  Worry too much - different things 1 2  Trouble relaxing 1 3  Restless 1 1  Easily annoyed or irritable 1 1  Afraid - awful might happen 0 0  Total GAD 7 Score 5 11  Anxiety Difficulty  Somewhat difficult   The 10-year ASCVD risk score (Arnett DK, et al., 2019) is: 10.8%   Values used to calculate the score:     Age: 85 years     Sex: Female     Is Non-Hispanic African American: No     Diabetic: Yes     Tobacco smoker: No     Systolic Blood Pressure: 125 mmHg      Is BP treated: Yes     HDL Cholesterol: 35 mg/dL     Total Cholesterol: 200 mg/dL  Pertinent labs & imaging results that were available during my care of the patient were reviewed by me and considered in my medical decision making.  Assessment & Plan:  Laruen was seen today for medical management of chronic issues.  Diagnoses and all orders for this visit:  1. Type 2 diabetes mellitus with other specified complication, without long-term current use of insulin (HCC) (Primary) Not at goal. Will restart GLP1 as below. Will switch to mounjaro per patient preference. As she has been out for >2 weeks, will start at base dose and titrate up. Discussed with patient to notify provider if she is about to run out of medications for me to refill so she does not miss any doses. Patient to continue to monitor BG at home and bring log into follow up. Labs as below. Will communicate results to patient once available. Will await results to determine next steps.  - Bayer DCA Hb A1c Waived - CMP14+EGFR - tirzepatide (MOUNJARO) 2.5 MG/0.5ML Pen; Inject 2.5 mg into the skin once a week for 4 doses.  Dispense: 2 mL; Refill: 0 - tirzepatide (MOUNJARO) 5 MG/0.5ML Pen; Inject 5 mg into the skin once a week for 4 doses.  Dispense: 2 mL; Refill: 0 - tirzepatide (MOUNJARO) 7.5 MG/0.5ML Pen; Inject 7.5 mg into the skin once a week for 4 doses.  Dispense: 2 mL; Refill: 0  2. Hypertension associated with diabetes (HCC) Well controlled. Continue current regimen. Labs as below. Will communicate results to patient once available. Will await results to determine next steps.  - CBC with Differential/Platelet  3. Hyperlipidemia associated with type 2 diabetes mellitus (HCC) Labs as below. Will communicate results to patient once available.  Will await results to determine next steps.  Continue statin. Based on previous ASCVD risk score, patient needs to stay on statin.  - Lipid panel  4. Acquired hypothyroidism Labs as  below. Will communicate results to patient once available. Will await results to determine next steps.  - Thyroid Panel With TSH  5. Vitamin D deficiency Will monitor labs at follow up.   6. Encounter for screening for depression Pt screened positive for depression and anxiety today. Pt offered nonpharmacologic and pharmacologic therapy. Pt declined initiating treatment at this time. Safety contract established today with patient in clinic. Denies intent to harm herself or others. Pt to notify provider if they would like to initiate treatment.   7. Long-term current use of injectable noninsulin antidiabetic medication As above.  - tirzepatide [facility]) 2.5 MG/0.5ML Pen; Inject 2.5 mg into the skin once a week for 4 doses.  Dispense: 2 mL; Refill: 0 - tirzepatide (MOUNJARO) 5 MG/0.5ML Pen; Inject 5 mg into the skin once a week for 4 doses.  Dispense: 2 mL; Refill: 0 - tirzepatide (MOUNJARO) 7.5 MG/0.5ML Pen; Inject 7.5 mg into the skin once a week for 4 doses.  Dispense: 2 mL; Refill: 0  8. Diabetes mellitus treated with oral medication (HCC) As above.    Continue all other maintenance medications.  Follow up plan: Return in about 3 months (around 07/20/2023) for Chronic Condition Follow up.   Continue healthy lifestyle choices, including diet (rich in fruits, vegetables, and lean proteins, and low in salt and simple carbohydrates) and exercise (at least 30 minutes of moderate physical activity daily).  Written and verbal instructions provided   The above assessment and management plan was discussed with the patient. The patient verbalized understanding of and has agreed to the management plan. Patient is aware to call the clinic if they develop any new symptoms or if symptoms persist or worsen. Patient is aware when to return to the clinic for a follow-up visit. Patient educated on when it is appropriate to go to the emergency department.   Neale Burly, DNP-FNP [facility] ([facility] Medicine [address] [place], [place] [address] [phone]

## 2023-04-23 LAB — CMP14+EGFR
ALT: 48 IU/L — ABNORMAL HIGH (ref 0–32)
AST: 36 IU/L (ref 0–40)
Albumin: 4.5 g/dL (ref 3.8–4.9)
Alkaline Phosphatase: 122 IU/L — ABNORMAL HIGH (ref 44–121)
BUN/Creatinine Ratio: 14 (ref 12–28)
BUN: 10 mg/dL (ref 8–27)
Bilirubin Total: 0.8 mg/dL (ref 0.0–1.2)
CO2: 24 mmol/L (ref 20–29)
Calcium: 10.4 mg/dL — ABNORMAL HIGH (ref 8.7–10.3)
Chloride: 99 mmol/L (ref 96–106)
Creatinine, Ser: 0.72 mg/dL (ref 0.57–1.00)
Globulin, Total: 2.4 g/dL (ref 1.5–4.5)
Glucose: 259 mg/dL — ABNORMAL HIGH (ref 70–99)
Potassium: 4.2 mmol/L (ref 3.5–5.2)
Sodium: 138 mmol/L (ref 134–144)
Total Protein: 6.9 g/dL (ref 6.0–8.5)
eGFR: 96 mL/min/{1.73_m2} (ref >59)

## 2023-04-23 LAB — THYROID PANEL WITH TSH
Free Thyroxine Index: 2.3 (ref 1.2–4.9)
T3 Uptake Ratio: 24 % (ref 24–39)
T4, Total: 9.5 ug/dL (ref 4.5–12.0)
TSH: 1.17 u[IU]/mL (ref 0.450–4.500)

## 2023-04-23 LAB — CBC WITH DIFFERENTIAL/PLATELET
Basophils Absolute: 0 10*3/uL (ref 0.0–0.2)
Basos: 1 %
EOS (ABSOLUTE): 0.1 10*3/uL (ref 0.0–0.4)
Eos: 2 %
Hematocrit: 41.5 % (ref 34.0–46.6)
Hemoglobin: 13.4 g/dL (ref 11.1–15.9)
Immature Grans (Abs): 0 10*3/uL (ref 0.0–0.1)
Immature Granulocytes: 0 %
Lymphocytes Absolute: 1.9 10*3/uL (ref 0.7–3.1)
Lymphs: 35 %
MCH: 28.4 pg (ref 26.6–33.0)
MCHC: 32.3 g/dL (ref 31.5–35.7)
MCV: 88 fL (ref 79–97)
Monocytes Absolute: 0.3 10*3/uL (ref 0.1–0.9)
Monocytes: 5 %
Neutrophils Absolute: 3.2 10*3/uL (ref 1.4–7.0)
Neutrophils: 57 %
Platelets: 195 10*3/uL (ref 150–450)
RBC: 4.72 x10E6/uL (ref 3.77–5.28)
RDW: 12.8 % (ref 11.7–15.4)
WBC: 5.6 10*3/uL (ref 3.4–10.8)

## 2023-04-23 LAB — LIPID PANEL
Chol/HDL Ratio: 3.9 ratio (ref 0.0–4.4)
Cholesterol, Total: 136 mg/dL (ref 100–199)
HDL: 35 mg/dL — ABNORMAL LOW (ref >39)
LDL Chol Calc (NIH): 75 mg/dL (ref 0–99)
Triglycerides: 151 mg/dL — ABNORMAL HIGH (ref 0–149)
VLDL Cholesterol Cal: 26 mg/dL (ref 5–40)

## 2023-04-28 ENCOUNTER — Encounter: Payer: Self-pay | Admitting: Family Medicine

## 2023-04-28 NOTE — Progress Notes (Signed)
 Liver enzymes improving. Will continue to monitor on future labs. Cholesterol is elevated. Diet encouraged - increase intake of fresh fruits and vegetables, increase intake of lean proteins. Bake, broil, or grill foods. Avoid fried, greasy, and fatty foods. Avoid fast foods. Increase intake of fiber-rich whole grains. Exercise encouraged - at least 150 minutes per week and advance as tolerated. Continue Lipitor. The 10-year ASCVD risk score (Arnett DK, et al., 2019) is: 8.1% A1C not at goal. Start mounjaro as planned.

## 2023-06-02 ENCOUNTER — Telehealth: Payer: Self-pay | Admitting: Family Medicine

## 2023-06-16 ENCOUNTER — Encounter: Payer: Self-pay | Admitting: Family Medicine

## 2023-06-16 DIAGNOSIS — E1169 Type 2 diabetes mellitus with other specified complication: Secondary | ICD-10-CM

## 2023-06-16 DIAGNOSIS — Z7985 Long-term (current) use of injectable non-insulin antidiabetic drugs: Secondary | ICD-10-CM

## 2023-06-21 MED ORDER — TIRZEPATIDE 12.5 MG/0.5ML ~~LOC~~ SOAJ: 12.5000 mg | SUBCUTANEOUS | 0 refills | Status: AC

## 2023-06-21 MED ORDER — TIRZEPATIDE 15 MG/0.5ML ~~LOC~~ SOAJ: 15.0000 mg | SUBCUTANEOUS | 2 refills | Status: AC

## 2023-06-21 MED ORDER — TIRZEPATIDE 10 MG/0.5ML ~~LOC~~ SOAJ: 10.0000 mg | SUBCUTANEOUS | 0 refills | Status: AC

## 2023-07-26 ENCOUNTER — Other Ambulatory Visit: Payer: Self-pay | Admitting: Family Medicine

## 2023-07-26 DIAGNOSIS — Z1231 Encounter for screening mammogram for malignant neoplasm of breast: Secondary | ICD-10-CM

## 2023-07-29 ENCOUNTER — Ambulatory Visit: Payer: BC Managed Care – PPO | Admitting: Family Medicine

## 2023-07-29 ENCOUNTER — Encounter: Payer: Self-pay | Admitting: Family Medicine

## 2023-07-29 VITALS — BP 106/73 | HR 75 | Temp 96.6°F | Ht 68.0 in | Wt 171.6 lb

## 2023-07-29 DIAGNOSIS — E1169 Type 2 diabetes mellitus with other specified complication: Secondary | ICD-10-CM | POA: Diagnosis not present

## 2023-07-29 DIAGNOSIS — I152 Hypertension secondary to endocrine disorders: Secondary | ICD-10-CM

## 2023-07-29 DIAGNOSIS — E6609 Other obesity due to excess calories: Secondary | ICD-10-CM

## 2023-07-29 DIAGNOSIS — E66811 Obesity, class 1: Secondary | ICD-10-CM

## 2023-07-29 DIAGNOSIS — N393 Stress incontinence (female) (male): Secondary | ICD-10-CM

## 2023-07-29 DIAGNOSIS — Z6831 Body mass index (BMI) 31.0-31.9, adult: Secondary | ICD-10-CM

## 2023-07-29 DIAGNOSIS — E1159 Type 2 diabetes mellitus with other circulatory complications: Secondary | ICD-10-CM

## 2023-07-29 DIAGNOSIS — E039 Hypothyroidism, unspecified: Secondary | ICD-10-CM

## 2023-07-29 DIAGNOSIS — F411 Generalized anxiety disorder: Secondary | ICD-10-CM

## 2023-07-29 DIAGNOSIS — F320 Major depressive disorder, single episode, mild: Secondary | ICD-10-CM

## 2023-07-29 DIAGNOSIS — Z7984 Long term (current) use of oral hypoglycemic drugs: Secondary | ICD-10-CM

## 2023-07-29 DIAGNOSIS — Z7985 Long-term (current) use of injectable non-insulin antidiabetic drugs: Secondary | ICD-10-CM | POA: Diagnosis not present

## 2023-07-29 DIAGNOSIS — E785 Hyperlipidemia, unspecified: Secondary | ICD-10-CM

## 2023-07-29 DIAGNOSIS — E559 Vitamin D deficiency, unspecified: Secondary | ICD-10-CM

## 2023-07-29 DIAGNOSIS — E119 Type 2 diabetes mellitus without complications: Secondary | ICD-10-CM

## 2023-07-29 LAB — LIPID PANEL

## 2023-07-29 LAB — BAYER DCA HB A1C WAIVED: HB A1C (BAYER DCA - WAIVED): 6.3 % — ABNORMAL HIGH (ref 4.8–5.6)

## 2023-07-29 NOTE — Progress Notes (Addendum)
 Subjective:  Patient ID: Judith Cain, female    DOB: 1960-10-07, 63 y.o.   MRN: [id number]  Patient Care Team: Chrystine Crate, FNP as PCP - General (Family Medicine) Sheryle Donning, MD as PCP - Cardiology (Cardiology) Hyland Mailman, MD as Referring Physician (Optometry)   Chief Complaint:  Medical Management of Chronic Issues   HPI: Judith Cain is a 63 y.o. female presenting on 07/29/2023 for Medical Management of Chronic Issues  HPI Hypertension associated with diabetes (HCC) Has BP monitor at home No BP at home average - not checking  ROS Denies anxiety, fatigue, peripheral edema, changes to vision, chest pain, headaches, palpitations, sweats, SOB, PND, orthopnea Meds losartan  CAD risks Diabetes Mellitus, hypertension, hypercholesterolemia/hyperlipidemia  Type 2 diabetes mellitus with other specified complication, without long-term current use of insulin (HCC) Glucometer: Reli On   High at home: 120; Low at home: 74, states that she was slightly weak with 88 Taking medication(s): mounjaro  States that her neuropathy has greatly improved since starting mounjaro.  States that she has  Last eye exam: 3-4 weeks ago.  Last foot exam: due in November  Last A1c:  Lab Results  Component Value Date   HGBA1C 6.3 (H) 07/29/2023   Nephropathy screen indicated?: due in November  Last flu, zoster and/or pneumovax:  Immunization History  Administered Date(s) Administered   Hep A, Unspecified 06/10/2014   Hepatitis A, Ped/Adol-2 Dose 09/12/2013, 03/04/2014   Hepatitis B, PED/ADOLESCENT 09/12/2013, 03/04/2014, 06/10/2014   Influenza, Quadrivalent, Recombinant, Inj, Pf 12/02/2016, 12/20/2016, 11/09/2018   Influenza, Seasonal, Injecte, Preservative Fre 01/13/2023   Influenza,inj,Quad PF,6+ Mos 12/04/2015, 12/01/2017, 12/16/2020, 11/04/2021   Influenza-Unspecified 02/25/2014, 12/19/2014   Pneumococcal Polysaccharide-23 09/12/2013, 10/16/2020   Tdap  09/05/2012   Zoster Recombinant(Shingrix) 10/09/2019, 12/12/2019    ROS: Denies dizziness, LOC, polyuria, polydipsia, unintended weight loss/gain, foot ulcerations, numbness or tingling in extremities, shortness of breath or chest pain.  Hyperlipidemia associated with type 2 diabetes mellitus (HCC) States that she is walking more and eating less.  Walking 34-40 minutes 3 days per week in park.   Acquired hypothyroidism Thyroid : Patient presents for evaluation of hypothyroidism. Denies fatigue, weight changes, heat/cold intolerance, bowel/skin changes or CVS symptoms.  Vitamin D deficiency States that she is still taking weekly supplement.  Denies numbness/tingling, bone pain, fractures.   Stress incontinence, female States that she is doing a little better. She is not interested in PT.    Depression/Anxiety  States that she is doing well. She does not have thoughts of self harm. Does not wish to start medication or counseling at this time.   Relevant past medical, surgical, family, and social history reviewed and updated as indicated.  Allergies and medications reviewed and updated. Data reviewed: Chart in Epic.   Past Medical History:  Diagnosis Date   Diabetes mellitus without complication (HCC)    Hypertension    Thyroid  disease     Past Surgical History:  Procedure Laterality Date   APPENDECTOMY  2014    Social History   Socioeconomic History   Marital status: Widowed    Spouse name: Not on file   Number of children: Not on file   Years of education: Not on file   Highest education level: 10th grade  Occupational History   Not on file  Tobacco Use   Smoking status: Never   Smokeless tobacco: Never  Substance and Sexual Activity   Alcohol use: Never   Drug use: Never  Sexual activity: Never  Other Topics Concern   Not on file  Social History Narrative   Not on file   Social Drivers of Health   Financial Resource Strain: Low Risk  (04/22/2023)    Overall Financial Resource Strain (CARDIA)    Difficulty of Paying Living Expenses: Not hard at all  Food Insecurity: No Food Insecurity (04/22/2023)   Hunger Vital Sign    Worried About Running Out of Food in the Last Year: Never true    Ran Out of Food in the Last Year: Never true  Transportation Needs: No Transportation Needs (04/22/2023)   PRAPARE - Administrator, Civil Service (Medical): No    Lack of Transportation (Non-Medical): No  Physical Activity: Sufficiently Active (04/22/2023)   Exercise Vital Sign    Days of Exercise per Week: 5 days    Minutes of Exercise per Session: 60 min  Stress: No Stress Concern Present (04/22/2023)   [employer] of Occupational Health - Occupational Stress Questionnaire    Feeling of Stress : Only a little  Social Connections: Moderately Isolated (04/22/2023)   Social Connection and Isolation Panel [NHANES]    Frequency of Communication with Friends and Family: More than three times a week    Frequency of Social Gatherings with Friends and Family: More than three times a week    Attends Religious Services: More than 4 times per year    Active Member of [employer] or Organizations: No    Attends Banker Meetings: Not on file    Marital Status: Widowed  Intimate Partner Violence: Not At Risk (04/22/2023)   Humiliation, Afraid, Rape, and Kick questionnaire    Fear of Current or Ex-Partner: No    Emotionally Abused: No    Physically Abused: No    Sexually Abused: No    Outpatient Encounter Medications as of 07/29/2023  Medication Sig   aspirin 81 MG chewable tablet Chew 81 mg by mouth daily.   atorvastatin  (LIPITOR) 80 MG tablet Take 1 tablet (80 mg total) by mouth daily.   Blood Glucose Monitoring Suppl (FREESTYLE FREEDOM) KIT by Other route.   Ferrous Sulfate (IRON PO) Take by mouth.   glipiZIDE (GLUCOTROL XL) 10 MG 24 hr tablet Take one pill per day   hydrOXYzine  (VISTARIL ) 25 MG capsule Take 1 capsule (25 mg total) by  mouth every 8 (eight) hours as needed.   IRON PO Take by mouth.   levothyroxine (SYNTHROID, LEVOTHROID) 50 MCG tablet Take 50 mcg by mouth daily.   losartan (COZAAR) 25 MG tablet Take 25 mg by mouth daily.   metFORMIN (GLUCOPHAGE) 500 MG tablet Take 500 mg by mouth 2 (two) times daily.   tirzepatide (MOUNJARO) 12.5 MG/0.5ML Pen Inject 12.5 mg into the skin once a week for 4 doses.   [START ON 08/20/2023] tirzepatide (MOUNJARO) 15 MG/0.5ML Pen Inject 15 mg into the skin once a week for 12 doses.   Vitamin D, Ergocalciferol, (DRISDOL) 1.25 MG (50000 UNIT) CAPS capsule Take 1 capsule by mouth once a week.   No facility-administered encounter medications on file as of 07/29/2023.    No Known Allergies  Review of Systems As per HPI  Objective:  BP 106/73   Pulse 75   Temp (!) 96.6 F (35.9 C)   Ht 5\' 8"  (1.727 m)   Wt 171 lb 9.6 oz (77.8 kg)   SpO2 97%   BMI 26.09 kg/m    Wt Readings from Last 3 Encounters:  07/29/23 171  lb 9.6 oz (77.8 kg)  04/22/23 186 lb 9.6 oz (84.6 kg)  01/13/23 192 lb (87.1 kg)   Physical Exam Constitutional:      General: She is awake. She is not in acute distress.    Appearance: Normal appearance. She is well-developed, well-groomed and overweight. She is not ill-appearing, toxic-appearing or diaphoretic.  Cardiovascular:     Rate and Rhythm: Normal rate and regular rhythm.     Pulses: Normal pulses.          Radial pulses are 2+ on the right side and 2+ on the left side.       Posterior tibial pulses are 2+ on the right side and 2+ on the left side.     Heart sounds: Normal heart sounds. No murmur heard.    No gallop.  Pulmonary:     Effort: Pulmonary effort is normal. No respiratory distress.     Breath sounds: Normal breath sounds. No stridor. No wheezing, rhonchi or rales.  Musculoskeletal:     Cervical back: Full passive range of motion without pain and neck supple.     Right lower leg: No edema.     Left lower leg: No edema.  Skin:     General: Skin is warm.     Capillary Refill: Capillary refill takes less than 2 seconds.  Neurological:     General: No focal deficit present.     Mental Status: She is alert, oriented to person, place, and time and easily aroused. Mental status is at baseline.     GCS: GCS eye subscore is 4. GCS verbal subscore is 5. GCS motor subscore is 6.     Motor: No weakness.  Psychiatric:        Attention and Perception: Attention and perception normal.        Mood and Affect: Mood and affect normal.        Speech: Speech normal.        Behavior: Behavior normal. Behavior is cooperative.        Thought Content: Thought content normal. Thought content does not include homicidal or suicidal ideation. Thought content does not include homicidal or suicidal plan.        Cognition and Memory: Cognition and memory normal.        Judgment: Judgment normal.     Results for orders placed or performed in visit on 04/22/23  Bayer DCA Hb A1c Waived   Collection Time: 04/22/23 10:22 AM  Result Value Ref Range   HB A1C (BAYER DCA - WAIVED) 10.0 (H) 4.8 - 5.6 %  CMP14+EGFR   Collection Time: 04/22/23 10:25 AM  Result Value Ref Range   Glucose 259 (H) 70 - 99 mg/dL   BUN 10 8 - 27 mg/dL   Creatinine, Ser 1.61 0.57 - 1.00 mg/dL   eGFR 96 >09 UE/AVW/0.98   BUN/Creatinine Ratio 14 12 - 28   Sodium 138 134 - 144 mmol/L   Potassium 4.2 3.5 - 5.2 mmol/L   Chloride 99 96 - 106 mmol/L   CO2 24 20 - 29 mmol/L   Calcium  10.4 (H) 8.7 - 10.3 mg/dL   Total Protein 6.9 6.0 - 8.5 g/dL   Albumin 4.5 3.8 - 4.9 g/dL   Globulin, Total 2.4 1.5 - 4.5 g/dL   Bilirubin Total 0.8 0.0 - 1.2 mg/dL   Alkaline Phosphatase 122 (H) 44 - 121 IU/L   AST 36 0 - 40 IU/L   ALT 48 (H) 0 - 32 IU/L  CBC with Differential/Platelet   Collection Time: 04/22/23 10:25 AM  Result Value Ref Range   WBC 5.6 3.4 - 10.8 x10E3/uL   RBC 4.72 3.77 - 5.28 x10E6/uL   Hemoglobin 13.4 11.1 - 15.9 g/dL   Hematocrit 62.1 30.8 - 46.6 %   MCV 88 79 -  97 fL   MCH 28.4 26.6 - 33.0 pg   MCHC 32.3 31.5 - 35.7 g/dL   RDW 65.7 84.6 - 96.2 %   Platelets 195 150 - 450 x10E3/uL   Neutrophils 57 Not Estab. %   Lymphs 35 Not Estab. %   Monocytes 5 Not Estab. %   Eos 2 Not Estab. %   Basos 1 Not Estab. %   Neutrophils Absolute 3.2 1.4 - 7.0 x10E3/uL   Lymphocytes Absolute 1.9 0.7 - 3.1 x10E3/uL   Monocytes Absolute 0.3 0.1 - 0.9 x10E3/uL   EOS (ABSOLUTE) 0.1 0.0 - 0.4 x10E3/uL   Basophils Absolute 0.0 0.0 - 0.2 x10E3/uL   Immature Granulocytes 0 Not Estab. %   Immature Grans (Abs) 0.0 0.0 - 0.1 x10E3/uL  Lipid panel   Collection Time: 04/22/23 10:25 AM  Result Value Ref Range   Cholesterol, Total 136 100 - 199 mg/dL   Triglycerides 952 (H) 0 - 149 mg/dL   HDL 35 (L) >84 mg/dL   VLDL Cholesterol Cal 26 5 - 40 mg/dL   LDL Chol Calc (NIH) 75 0 - 99 mg/dL   Chol/HDL Ratio 3.9 0.0 - 4.4 ratio  Thyroid  Panel With TSH   Collection Time: 04/22/23 10:25 AM  Result Value Ref Range   TSH 1.170 0.450 - 4.500 uIU/mL   T4, Total 9.5 4.5 - 12.0 ug/dL   T3 Uptake Ratio 24 24 - 39 %   Free Thyroxine Index 2.3 1.2 - 4.9      07/29/2023    8:25 AM 04/22/2023   10:37 AM 01/13/2023    8:48 AM  Depression screen PHQ 2/9  Decreased Interest 0 0 0  Down, Depressed, Hopeless 0 0 0  PHQ - 2 Score 0 0 0  Altered sleeping 1 1 3   Tired, decreased energy 2 2 3   Change in appetite 0 0 0  Feeling bad or failure about yourself  0 0 0  Trouble concentrating 1 3 0  Moving slowly or fidgety/restless 0 0 0  Suicidal thoughts 0 0 0  PHQ-9 Score 4 6 6   Difficult doing work/chores Not difficult at all Not difficult at all Not difficult at all      07/29/2023    8:25 AM 04/22/2023   10:37 AM 01/13/2023    8:49 AM  GAD 7 : Generalized Anxiety Score  Nervous, Anxious, on Edge 2 0 2  Control/stop worrying 1 1 2   Worry too much - different things 1 1 2   Trouble relaxing 1 1 3   Restless 1 1 1   Easily annoyed or irritable 2 1 1   Afraid - awful might happen 0 0  0  Total GAD 7 Score 8 5 11   Anxiety Difficulty Somewhat difficult  Somewhat difficult   Pertinent labs & imaging results that were available during my care of the patient were reviewed by me and considered in my medical decision making.  Assessment & Plan:  Glenette was seen today for medical management of chronic issues.  Diagnoses and all orders for this visit: 1. Hypertension associated with diabetes (HCC) (Primary) Well controlled. Continue current regimen. Labs as below. Will communicate results to patient once available.  Will await results to determine next steps.  - CBC with Differential/Platelet - CMP14+EGFR  2. Type 2 diabetes mellitus with other specified complication, without long-term current use of insulin (HCC) A1C is 6.3%!!  Continue current regimen.  - Bayer DCA Hb A1c Waived - CBC with Differential/Platelet - CMP14+EGFR  3. Diabetes mellitus treated with oral medication (HCC) As above.  - Bayer DCA Hb A1c Waived - CBC with Differential/Platelet - CMP14+EGFR  4. Long-term current use of injectable noninsulin antidiabetic medication As above.  - Bayer DCA Hb A1c Waived - CBC with Differential/Platelet - CMP14+EGFR  5. Hyperlipidemia associated with type 2 diabetes mellitus (HCC) Labs as below. Will communicate results to patient once available. Will await results to determine next steps.  Fasting.  - Lipid panel - CBC with Differential/Platelet - CMP14+EGFR  6. Acquired hypothyroidism Labs as below. Will communicate results to patient once available. Will await results to determine next steps.  Stable.  - CBC with Differential/Platelet - CMP14+EGFR - TSH  7. Class 1 obesity due to excess calories with serious comorbidity and body mass index (BMI) of 31.0 to 31.9 in adult Praised patient on her exercise and diet changes.   8. Vitamin D deficiency Labs as below. Will communicate results to patient once available. Will await results to determine next  steps.  - VITAMIN D 25 Hydroxy (Vit-D Deficiency, Fractures)  9. Stress incontinence, female Improving. Declines referral to PT at this time.   10. GAD (generalized anxiety disorder) Stable. Denies SI. Pt offered nonpharmacologic and pharmacologic therapy. Pt declined initiating treatment at this time. Safety contract established today with patient in clinic. Pt to notify provider if they would like to initiate treatment.   11. Depression, major, single episode, mild (HCC) As above    Continue all other maintenance medications.  Follow up plan: Return in about 3 months (around 10/29/2023) for Chronic Condition Follow up.  Continue healthy lifestyle choices, including diet (rich in fruits, vegetables, and lean proteins, and low in salt and simple carbohydrates) and exercise (at least 30 minutes of moderate physical activity daily).  Written and verbal instructions provided   The above assessment and management plan was discussed with the patient. The patient verbalized understanding of and has agreed to the management plan. Patient is aware to call the clinic if they develop any new symptoms or if symptoms persist or worsen. Patient is aware when to return to the clinic for a follow-up visit. Patient educated on when it is appropriate to go to the emergency department.   Jacqualyn Mates, DNP-FNP [facility] [address] [place], [place] [address] [phone]

## 2023-07-30 LAB — CBC WITH DIFFERENTIAL/PLATELET
Basophils Absolute: 0 10*3/uL (ref 0.0–0.2)
Basos: 1 %
EOS (ABSOLUTE): 0.1 10*3/uL (ref 0.0–0.4)
Eos: 2 %
Hematocrit: 43.4 % (ref 34.0–46.6)
Hemoglobin: 13.9 g/dL (ref 11.1–15.9)
Immature Grans (Abs): 0 10*3/uL (ref 0.0–0.1)
Immature Granulocytes: 1 %
Lymphocytes Absolute: 2.1 10*3/uL (ref 0.7–3.1)
Lymphs: 37 %
MCH: 28.5 pg (ref 26.6–33.0)
MCHC: 32 g/dL (ref 31.5–35.7)
MCV: 89 fL (ref 79–97)
Monocytes Absolute: 0.5 10*3/uL (ref 0.1–0.9)
Monocytes: 9 %
Neutrophils Absolute: 3 10*3/uL (ref 1.4–7.0)
Neutrophils: 50 %
Platelets: 202 10*3/uL (ref 150–450)
RBC: 4.88 x10E6/uL (ref 3.77–5.28)
RDW: 12.6 % (ref 11.7–15.4)
WBC: 5.8 10*3/uL (ref 3.4–10.8)

## 2023-07-30 LAB — CMP14+EGFR
ALT: 28 IU/L (ref 0–32)
AST: 21 IU/L (ref 0–40)
Albumin: 4.5 g/dL (ref 3.9–4.9)
Alkaline Phosphatase: 103 IU/L (ref 44–121)
BUN/Creatinine Ratio: 15 (ref 12–28)
BUN: 12 mg/dL (ref 8–27)
Bilirubin Total: 0.7 mg/dL (ref 0.0–1.2)
CO2: 22 mmol/L (ref 20–29)
Calcium: 9.8 mg/dL (ref 8.7–10.3)
Chloride: 107 mmol/L — ABNORMAL HIGH (ref 96–106)
Creatinine, Ser: 0.78 mg/dL (ref 0.57–1.00)
Globulin, Total: 2.3 g/dL (ref 1.5–4.5)
Glucose: 133 mg/dL — ABNORMAL HIGH (ref 70–99)
Potassium: 5 mmol/L (ref 3.5–5.2)
Sodium: 141 mmol/L (ref 134–144)
Total Protein: 6.8 g/dL (ref 6.0–8.5)
eGFR: 86 mL/min/{1.73_m2} (ref >59)

## 2023-07-30 LAB — LIPID PANEL
Chol/HDL Ratio: 7.3 ratio — ABNORMAL HIGH (ref 0.0–4.4)
Cholesterol, Total: 226 mg/dL — ABNORMAL HIGH (ref 100–199)
HDL: 31 mg/dL — ABNORMAL LOW (ref >39)
LDL Chol Calc (NIH): 159 mg/dL — ABNORMAL HIGH (ref 0–99)
Triglycerides: 194 mg/dL — ABNORMAL HIGH (ref 0–149)
VLDL Cholesterol Cal: 36 mg/dL (ref 5–40)

## 2023-07-30 LAB — TSH: TSH: 1.91 u[IU]/mL (ref 0.450–4.500)

## 2023-07-30 LAB — VITAMIN D 25 HYDROXY (VIT D DEFICIENCY, FRACTURES): Vit D, 25-Hydroxy: 39.3 ng/mL (ref 30.0–100.0)

## 2023-08-01 ENCOUNTER — Ambulatory Visit: Payer: Self-pay | Admitting: Family Medicine

## 2023-08-01 NOTE — Progress Notes (Signed)
 A1C looks great! Much improved!  Cholesterol remains elevated, recommend diet and lifestyle modification. Continue lipitor. Slightly elevated chloride, not concerning at this time.

## 2023-08-08 ENCOUNTER — Ambulatory Visit
Admission: RE | Admit: 2023-08-08 | Discharge: 2023-08-08 | Disposition: A | Discharge status: Home / Self Care | Source: Ambulatory Visit | Attending: Family Medicine | Admitting: Family Medicine

## 2023-08-08 DIAGNOSIS — Z1231 Encounter for screening mammogram for malignant neoplasm of breast: Secondary | ICD-10-CM

## 2023-08-11 ENCOUNTER — Ambulatory Visit: Payer: Self-pay | Admitting: Family Medicine

## 2023-08-11 NOTE — Progress Notes (Signed)
Normal screening Repeat in one year

## 2023-08-19 ENCOUNTER — Telehealth: Payer: Self-pay | Admitting: Family Medicine

## 2023-09-09 ENCOUNTER — Telehealth: Admitting: Physician Assistant

## 2023-09-09 DIAGNOSIS — H6502 Acute serous otitis media, left ear: Secondary | ICD-10-CM

## 2023-09-09 MED ORDER — AMOXICILLIN-POT CLAVULANATE 875-125 MG PO TABS
1.0000 | ORAL_TABLET | Freq: Two times a day (BID) | ORAL | 0 refills | Status: DC
Start: 2023-09-09 — End: 2023-11-07

## 2023-09-09 NOTE — Progress Notes (Signed)

## 2023-11-07 ENCOUNTER — Encounter: Payer: Self-pay | Admitting: Family Medicine

## 2023-11-07 ENCOUNTER — Ambulatory Visit: Admitting: Family Medicine

## 2023-11-07 VITALS — BP 96/59 | HR 61 | Temp 97.7°F | Ht 68.0 in | Wt 152.6 lb

## 2023-11-07 DIAGNOSIS — E785 Hyperlipidemia, unspecified: Secondary | ICD-10-CM

## 2023-11-07 DIAGNOSIS — E1159 Type 2 diabetes mellitus with other circulatory complications: Secondary | ICD-10-CM | POA: Diagnosis not present

## 2023-11-07 DIAGNOSIS — I152 Hypertension secondary to endocrine disorders: Secondary | ICD-10-CM

## 2023-11-07 DIAGNOSIS — E1165 Type 2 diabetes mellitus with hyperglycemia: Secondary | ICD-10-CM | POA: Diagnosis not present

## 2023-11-07 DIAGNOSIS — E1169 Type 2 diabetes mellitus with other specified complication: Secondary | ICD-10-CM

## 2023-11-07 DIAGNOSIS — F320 Major depressive disorder, single episode, mild: Secondary | ICD-10-CM | POA: Insufficient documentation

## 2023-11-07 DIAGNOSIS — Z7985 Long-term (current) use of injectable non-insulin antidiabetic drugs: Secondary | ICD-10-CM | POA: Diagnosis not present

## 2023-11-07 DIAGNOSIS — F411 Generalized anxiety disorder: Secondary | ICD-10-CM | POA: Insufficient documentation

## 2023-11-07 DIAGNOSIS — I700 Atherosclerosis of aorta: Secondary | ICD-10-CM

## 2023-11-07 DIAGNOSIS — E039 Hypothyroidism, unspecified: Secondary | ICD-10-CM

## 2023-11-07 LAB — BAYER DCA HB A1C WAIVED: HB A1C (BAYER DCA - WAIVED): 5.2 % (ref 4.8–5.6)

## 2023-11-07 LAB — LIPID PANEL
Chol/HDL Ratio: 6.8 ratio — ABNORMAL HIGH (ref 0.0–4.4)
Cholesterol, Total: 210 mg/dL — ABNORMAL HIGH (ref 100–199)
HDL: 31 mg/dL — ABNORMAL LOW (ref >39)
LDL Chol Calc (NIH): 153 mg/dL — ABNORMAL HIGH (ref 0–99)
Triglycerides: 143 mg/dL (ref 0–149)
VLDL Cholesterol Cal: 26 mg/dL (ref 5–40)

## 2023-11-07 LAB — TSH: TSH: 1.81 u[IU]/mL (ref 0.450–4.500)

## 2023-11-07 MED ORDER — LEVOTHYROXINE SODIUM 50 MCG PO TABS
50.0000 ug | ORAL_TABLET | Freq: Every day | ORAL | 3 refills | Status: AC
Start: 2023-11-07 — End: ?

## 2023-11-07 MED ORDER — MOUNJARO 15 MG/0.5ML ~~LOC~~ SOAJ: 15.0000 mg | SUBCUTANEOUS | 3 refills | Status: DC

## 2023-11-07 MED ORDER — LOSARTAN POTASSIUM 25 MG PO TABS: 25.0000 mg | ORAL_TABLET | Freq: Every day | ORAL | 3 refills | Status: AC

## 2023-11-07 NOTE — Progress Notes (Signed)
 Established Patient Office Visit  Subjective   Patient ID: Judith Cain, female    DOB: 1960/08/28  Age: 63 y.o. MRN: [id number]  Chief Complaint  Patient presents with   Medical Management of Chronic Issues    HPI  History of Present Illness   Judith Cain is a 63 year old female with type 2 diabetes, hyperlipidemia, and hypertension who presents for medication management and follow-up.  Glycemic control and antidiabetic medication effects - Type 2 diabetes managed with Mounjaro  and glipizide - Metformin discontinued 1 month ago due to burning sensations in feet and legs - Blood glucose stable at 100-105 mg/dL before bed - No hypoglycemic episodes - Previously used Trulicity  without significant effect; Mounjaro  found to be more effective  Dyslipidemia management - Takes atorvastatin  80 mg daily for hyperlipidemia  Hypertension and associated symptoms - Takes losartan  25 mg daily for hypertension - Occasional dizziness or lightheadedness - Does not monitor blood pressure at home - No chest pain or shortness of breath  Dietary and weight changes - Diet changed with Mounjaro , resulting in weight loss from size 16 to size 10 - Snacks throughout the day, avoids sweets, consumes plenty of vegetables and protein - Decreased taste for sweets - She has been continuing to lose weight  Physical activity - Engages in walking, gardening, and canning - Lives with her grandson and often walks with him  Sleep disturbance - Occasional difficulty sleeping, especially around significant dates related to her husband's passing         11/07/2023    8:55 AM 07/29/2023    8:25 AM 04/22/2023   10:37 AM  Depression screen PHQ 2/9  Decreased Interest 0 0 0  Down, Depressed, Hopeless 0 0 0  PHQ - 2 Score 0 0 0  Altered sleeping 1 1 1   Tired, decreased energy 1 2 2   Change in appetite 0 0 0  Feeling bad or failure about yourself  0 0 0  Trouble concentrating 0 1 3  Moving slowly or  fidgety/restless 0 0 0  Suicidal thoughts 0 0 0  PHQ-9 Score 2 4 6   Difficult doing work/chores Not difficult at all Not difficult at all Not difficult at all      11/07/2023    8:56 AM 07/29/2023    8:25 AM 04/22/2023   10:37 AM 01/13/2023    8:49 AM  GAD 7 : Generalized Anxiety Score  Nervous, Anxious, on Edge 1 2 0 2  Control/stop worrying 0 1 1 2   Worry too much - different things 0 1 1 2   Trouble relaxing 1 1 1 3   Restless 0 1 1 1   Easily annoyed or irritable 1 2 1 1   Afraid - awful might happen 0 0 0 0  Total GAD 7 Score 3 8 5 11   Anxiety Difficulty Not difficult at all Somewhat difficult  Somewhat difficult       ROS Negative unless specially indicated above in HPI.    Objective:     BP (!) 96/59   Pulse 61   Temp 97.7 F (36.5 C) (Temporal)   Ht 5' 8 (1.727 m)   Wt 152 lb 9.6 oz (69.2 kg)   SpO2 100%   BMI 23.20 kg/m    Physical Exam Vitals and nursing note reviewed.  Constitutional:      General: She is not in acute distress.    Appearance: Normal appearance. She is not ill-appearing, toxic-appearing or diaphoretic.  HENT:  Right Ear: Tympanic membrane, ear canal and external ear normal.     Left Ear: Tympanic membrane, ear canal and external ear normal.     Nose: Nose normal.     Mouth/Throat:     Mouth: Mucous membranes are moist.     Pharynx: Oropharynx is clear.  Cardiovascular:     Rate and Rhythm: Normal rate and regular rhythm.     Pulses: Normal pulses.     Heart sounds: Normal heart sounds. No murmur heard. Pulmonary:     Effort: Pulmonary effort is normal. No respiratory distress.     Breath sounds: Normal breath sounds.  Abdominal:     General: Bowel sounds are normal. There is no distension.     Palpations: Abdomen is soft. There is no mass.     Tenderness: There is no abdominal tenderness. There is no guarding or rebound.  Musculoskeletal:     Cervical back: Neck supple. No tenderness.     Right lower leg: No edema.     Left  lower leg: No edema.  Lymphadenopathy:     Cervical: No cervical adenopathy.  Skin:    General: Skin is warm and dry.  Neurological:     General: No focal deficit present.     Mental Status: She is alert and oriented to person, place, and time.  Psychiatric:        Mood and Affect: Mood normal.        Behavior: Behavior normal.      No results found for any visits on 11/07/23.    The 10-year ASCVD risk score (Arnett DK, et al., 2019) is: 8.2%    Assessment & Plan:   Judith Cain was seen today for medical management of chronic issues.  Diagnoses and all orders for this visit:  Type 2 diabetes mellitus with other specified complication, without long-term current use of insulin (HCC) -     Bayer DCA Hb A1c Waived -     losartan  (COZAAR ) 25 MG tablet; Take 1 tablet (25 mg total) by mouth daily. -     MOUNJARO  15 MG/0.5ML Pen; Inject 15 mg into the skin once a week.  Long-term current use of injectable noninsulin antidiabetic medication  Hypertension associated with diabetes (HCC) -     losartan  (COZAAR ) 25 MG tablet; Take 1 tablet (25 mg total) by mouth daily.  Hyperlipidemia associated with type 2 diabetes mellitus (HCC) -     Lipid panel  Acquired hypothyroidism -     TSH -     levothyroxine  (SYNTHROID ) 50 MCG tablet; Take 1 tablet (50 mcg total) by mouth daily.  Aortic atherosclerosis (HCC)  GAD (generalized anxiety disorder)  Depression, major, single episode, mild (HCC)      Type 2 diabetes mellitus Well-controlled with A1c of 5.2. Discontinued metformin due to side effects. Discussed discontinuing glipizide due to well-controlled A1c.  - Discontinue glipizide. - Monitor blood glucose levels at home. - Adjust Mounjaro  dosage if weight loss continues or blood glucose levels increase. - On ARB and statin  Hypothyroidism Managed with levothyroxine  50 micrograms daily. - Check thyroid  levels with current lab work. - Send levothyroxine  prescription to  pharmacy.  Hyperlipidemia Managed with atorvastatin  80 mg daily. Previous cholesterol levels high, fasting status uncertain. - Check cholesterol levels with current lab work. - Continue atorvastatin  80 mg daily.  Hypertension Managed with losartan  25 mg daily. Blood pressure well-controlled, slightly low when fasting. - Continue losartan  25 mg daily.  Depression/anxiety Currently well controlled with  lifestyle management.  Aortic atherosclerosis Continue aspirin.   General Health Maintenance Last Pap smear in 2012, no hysterectomy. - Schedule Pap smear during next physical exam.      Return in 6 months (on 05/06/2024) for CPE with pap.   The patient indicates understanding of these issues and agrees with the plan.  Judith CHRISTELLA Search, FNP

## 2023-11-09 ENCOUNTER — Ambulatory Visit: Payer: Self-pay | Admitting: Family Medicine

## 2023-11-09 MED ORDER — EZETIMIBE 10 MG PO TABS: 10.0000 mg | ORAL_TABLET | Freq: Every day | ORAL | 3 refills | Status: AC

## 2023-11-18 ENCOUNTER — Encounter: Payer: Self-pay | Admitting: Family Medicine

## 2023-12-22 ENCOUNTER — Encounter: Payer: Self-pay | Admitting: Family Medicine

## 2023-12-22 DIAGNOSIS — E1165 Type 2 diabetes mellitus with hyperglycemia: Secondary | ICD-10-CM

## 2023-12-22 MED ORDER — TIRZEPATIDE 10 MG/0.5ML ~~LOC~~ SOAJ
10.0000 mg | SUBCUTANEOUS | 6 refills | Status: AC
Start: 2023-12-22 — End: ?

## 2024-01-20 ENCOUNTER — Encounter: Payer: Self-pay | Admitting: Family Medicine

## 2024-01-20 ENCOUNTER — Ambulatory Visit: Admitting: Family Medicine

## 2024-01-20 VITALS — BP 111/67 | HR 88 | Temp 98.0°F | Ht 68.0 in | Wt 138.0 lb

## 2024-01-20 DIAGNOSIS — U071 COVID-19: Secondary | ICD-10-CM | POA: Diagnosis not present

## 2024-01-20 DIAGNOSIS — E1165 Type 2 diabetes mellitus with hyperglycemia: Secondary | ICD-10-CM | POA: Diagnosis not present

## 2024-01-20 DIAGNOSIS — J069 Acute upper respiratory infection, unspecified: Secondary | ICD-10-CM | POA: Diagnosis not present

## 2024-01-20 DIAGNOSIS — E1159 Type 2 diabetes mellitus with other circulatory complications: Secondary | ICD-10-CM

## 2024-01-20 DIAGNOSIS — E785 Hyperlipidemia, unspecified: Secondary | ICD-10-CM

## 2024-01-20 DIAGNOSIS — I152 Hypertension secondary to endocrine disorders: Secondary | ICD-10-CM

## 2024-01-20 DIAGNOSIS — E1169 Type 2 diabetes mellitus with other specified complication: Secondary | ICD-10-CM

## 2024-01-20 LAB — VERITOR SARS-COV-2 AND FLU A+B
BD Veritor SARS-CoV-2 Ag: POSITIVE — AB
Influenza A: NEGATIVE
Influenza B: NEGATIVE

## 2024-01-20 MED ORDER — NIRMATRELVIR/RITONAVIR (PAXLOVID)TABLET: 3.0000 | ORAL_TABLET | Freq: Two times a day (BID) | ORAL | 0 refills | Status: AC

## 2024-01-20 NOTE — Progress Notes (Signed)
 Acute Office Visit  Subjective:     Patient ID: Judith Cain, female    DOB: 12-20-1960, 63 y.o.   MRN: [id number]  Chief Complaint  Patient presents with   flu like symptoms    HPI  History of Present Illness   Judith Cain is a 63 year old female who presents with cough, congestion, and fever.  Respiratory and systemic symptoms - Cough and nasal congestion present for two days - Subjective fever for two days; temperature not measured - No chills, nausea, vomiting, shortness of breath, or wheezing - No COVID-19 vaccinations received - Previous COVID-19 infection approximately four years ago - No medications taken for current symptoms - Slight improvement in symptoms since onset  Headache - Headache at onset of illness - Headache has since improved  Chronic medical conditions - Hypertension - Type 2 diabetes mellitus - Currently taking atorvastatin        ROS As per HPI.      Objective:    BP 111/67   Pulse 88   Temp 98 F (36.7 C)   Ht 5' 8 (1.727 m)   Wt 138 lb (62.6 kg)   SpO2 98%   BMI 20.98 kg/m    Physical Exam Vitals and nursing note reviewed.  Constitutional:      General: She is not in acute distress.    Appearance: She is ill-appearing. She is not toxic-appearing or diaphoretic.  HENT:     Right Ear: Tympanic membrane, ear canal and external ear normal.     Left Ear: Tympanic membrane, ear canal and external ear normal.     Nose: Congestion present.     Mouth/Throat:     Mouth: Mucous membranes are moist.     Pharynx: Posterior oropharyngeal erythema present. No oropharyngeal exudate.     Tonsils: No tonsillar exudate or tonsillar abscesses. 1+ on the right. 1+ on the left.  Eyes:     General:        Right eye: No discharge.        Left eye: No discharge.  Cardiovascular:     Rate and Rhythm: Normal rate and regular rhythm.     Heart sounds: Normal heart sounds. No murmur heard. Pulmonary:     Effort: Pulmonary effort is  normal. No respiratory distress.     Breath sounds: Normal breath sounds. No wheezing, rhonchi or rales.  Abdominal:     General: There is no distension.     Palpations: Abdomen is soft.     Tenderness: There is no abdominal tenderness.  Musculoskeletal:     Cervical back: Neck supple. No rigidity.     Right lower leg: No edema.     Left lower leg: No edema.  Skin:    General: Skin is warm and dry.  Neurological:     General: No focal deficit present.     Mental Status: She is alert and oriented to person, place, and time.     No results found for any visits on 01/20/24.      Assessment & Plan:   Judith Cain was seen today for flu like symptoms.  Diagnoses and all orders for this visit:  Acute URI -     Veritor SARS-CoV-2 and Flu A+B -     Cancel: RSV Ag, Immunochr, Waived  COVID-19 -     nirmatrelvir /ritonavir  (PAXLOVID ) 20 x 150 MG & 10 x 100MG  TABS; Take 3 tablets by mouth 2 (two) times daily for 5 days. (Take  nirmatrelvir  150 mg two tablets twice daily for 5 days and ritonavir  100 mg one tablet twice daily for 5 days) Patient GFR is 86.  Type 2 diabetes mellitus with hyperglycemia, without long-term current use of insulin (HCC)  Hypertension associated with diabetes (HCC)  Hyperlipidemia associated with type 2 diabetes mellitus (HCC)   Assessment and Plan    COVID-19 infection Positive COVID-19 test with symptoms. Eligible for Paxlovid  due to hypertension, HLD, and type 2 diabetes. Discussed Paxlovid  for hospitalization risk reduction. Atorvastatin  interaction noted. - Prescribed Paxlovid  for 5 days. - Instructed to discontinue atorvastatin  for 5 days of Paxlovid  and 3 days after. - Advised to stay home until fever-free for 24 hours and respiratory symptoms improve. - Provided work note for missed days. - Advised on hand washing and mask-wearing to prevent spread. - Symptomatic care       Return to office for new or worsening symptoms, or if symptoms persist.    The patient indicates understanding of these issues and agrees with the plan.  Judith Cain Search, FNP

## 2024-01-23 ENCOUNTER — Telehealth: Payer: Self-pay | Admitting: Family Medicine

## 2024-01-23 DIAGNOSIS — Z0279 Encounter for issue of other medical certificate: Secondary | ICD-10-CM

## 2024-01-23 NOTE — Telephone Encounter (Signed)
 [employer] faxed DISABILITY forms to be completed  Form Fee Paid? (Y/N)       YES     If NO, form is placed on front office manager desk to hold until payment received. If YES, then form will be placed in the RX/HH Nurse Coordinators box for completion.  Form will not be processed until payment is received

## 2024-01-31 ENCOUNTER — Encounter: Payer: Self-pay | Admitting: *Deleted

## 2024-01-31 NOTE — Telephone Encounter (Signed)
 PCP completed and signed FMLA forms. They have been faxed to [facility] on 01/25/24 at fax number [phone]. Patient has been contacted and informed they are complete.

## 2024-05-14 ENCOUNTER — Encounter: Payer: Self-pay | Admitting: Family Medicine

## 2024-05-18 ENCOUNTER — Encounter: Admitting: Family Medicine
